# Patient Record
Sex: Female | Born: 1960
Health system: Southern US, Community
[De-identification: ages and names within clinical notes are randomized; demographics above are authoritative.]

## PROBLEM LIST (undated history)

## (undated) DIAGNOSIS — M51379 Other intervertebral disc degeneration, lumbosacral region without mention of lumbar back pain or lower extremity pain: Secondary | ICD-10-CM

## (undated) DIAGNOSIS — F32A Depression, unspecified: Secondary | ICD-10-CM

## (undated) DIAGNOSIS — I10 Essential (primary) hypertension: Secondary | ICD-10-CM

## (undated) DIAGNOSIS — J449 Chronic obstructive pulmonary disease, unspecified: Secondary | ICD-10-CM

## (undated) DIAGNOSIS — F329 Major depressive disorder, single episode, unspecified: Secondary | ICD-10-CM

## (undated) DIAGNOSIS — E559 Vitamin D deficiency, unspecified: Secondary | ICD-10-CM

## (undated) DIAGNOSIS — M5137 Other intervertebral disc degeneration, lumbosacral region: Secondary | ICD-10-CM

## (undated) DIAGNOSIS — E785 Hyperlipidemia, unspecified: Secondary | ICD-10-CM

## (undated) DIAGNOSIS — M1990 Unspecified osteoarthritis, unspecified site: Secondary | ICD-10-CM

## (undated) DIAGNOSIS — K649 Unspecified hemorrhoids: Secondary | ICD-10-CM

## (undated) DIAGNOSIS — IMO0002 Reserved for concepts with insufficient information to code with codable children: Secondary | ICD-10-CM

## (undated) HISTORY — PX: TONSILLECTOMY: SUR1361

## (undated) HISTORY — DX: Major depressive disorder, single episode, unspecified: F32.9

## (undated) HISTORY — DX: Vitamin D deficiency, unspecified: E55.9

## (undated) HISTORY — DX: Unspecified osteoarthritis, unspecified site: M19.90

## (undated) HISTORY — DX: Unspecified hemorrhoids: K64.9

## (undated) HISTORY — DX: Essential (primary) hypertension: I10

## (undated) HISTORY — DX: Chronic obstructive pulmonary disease, unspecified: J44.9

## (undated) HISTORY — PX: MOUTH SURGERY: SHX715

## (undated) HISTORY — DX: Depression, unspecified: F32.A

## (undated) HISTORY — DX: Reserved for concepts with insufficient information to code with codable children: IMO0002

---

## 1999-06-15 ENCOUNTER — Other Ambulatory Visit: Admission: RE | Admit: 1999-06-15 | Discharge: 1999-06-15 | Payer: Self-pay | Admitting: Gynecology

## 2000-06-01 HISTORY — PX: VAGINAL HYSTERECTOMY: SUR661

## 2000-06-04 ENCOUNTER — Other Ambulatory Visit: Admission: RE | Admit: 2000-06-04 | Discharge: 2000-06-04 | Payer: Self-pay | Admitting: *Deleted

## 2000-06-04 ENCOUNTER — Encounter (INDEPENDENT_AMBULATORY_CARE_PROVIDER_SITE_OTHER): Payer: Self-pay | Admitting: Specialist

## 2000-12-10 ENCOUNTER — Other Ambulatory Visit: Admission: RE | Admit: 2000-12-10 | Discharge: 2000-12-10 | Payer: Self-pay | Admitting: *Deleted

## 2000-12-10 ENCOUNTER — Encounter: Admission: RE | Admit: 2000-12-10 | Discharge: 2000-12-10 | Payer: Self-pay | Admitting: *Deleted

## 2000-12-10 ENCOUNTER — Encounter: Payer: Self-pay | Admitting: *Deleted

## 2001-01-20 ENCOUNTER — Other Ambulatory Visit: Admission: RE | Admit: 2001-01-20 | Discharge: 2001-01-20 | Payer: Self-pay | Admitting: *Deleted

## 2001-07-29 ENCOUNTER — Other Ambulatory Visit: Admission: RE | Admit: 2001-07-29 | Discharge: 2001-07-29 | Payer: Self-pay | Admitting: *Deleted

## 2002-01-15 ENCOUNTER — Encounter: Payer: Self-pay | Admitting: *Deleted

## 2002-01-15 ENCOUNTER — Encounter: Admission: RE | Admit: 2002-01-15 | Discharge: 2002-01-15 | Payer: Self-pay | Admitting: *Deleted

## 2002-01-15 ENCOUNTER — Other Ambulatory Visit: Admission: RE | Admit: 2002-01-15 | Discharge: 2002-01-15 | Payer: Self-pay | Admitting: *Deleted

## 2002-12-09 ENCOUNTER — Encounter: Payer: Self-pay | Admitting: Family Medicine

## 2002-12-09 ENCOUNTER — Ambulatory Visit (HOSPITAL_COMMUNITY): Admission: RE | Admit: 2002-12-09 | Discharge: 2002-12-09 | Payer: Self-pay | Admitting: Family Medicine

## 2003-01-27 ENCOUNTER — Encounter: Admission: RE | Admit: 2003-01-27 | Discharge: 2003-01-27 | Payer: Self-pay | Admitting: *Deleted

## 2003-01-27 ENCOUNTER — Encounter: Payer: Self-pay | Admitting: *Deleted

## 2003-02-04 ENCOUNTER — Encounter (HOSPITAL_COMMUNITY): Admission: RE | Admit: 2003-02-04 | Discharge: 2003-03-06 | Payer: Self-pay | Admitting: Neurosurgery

## 2003-03-10 ENCOUNTER — Other Ambulatory Visit: Admission: RE | Admit: 2003-03-10 | Discharge: 2003-03-10 | Payer: Self-pay | Admitting: *Deleted

## 2004-02-08 ENCOUNTER — Ambulatory Visit (HOSPITAL_COMMUNITY): Admission: RE | Admit: 2004-02-08 | Discharge: 2004-02-08 | Payer: Self-pay | Admitting: *Deleted

## 2004-04-19 ENCOUNTER — Other Ambulatory Visit: Admission: RE | Admit: 2004-04-19 | Discharge: 2004-04-19 | Payer: Self-pay | Admitting: *Deleted

## 2005-02-14 ENCOUNTER — Ambulatory Visit (HOSPITAL_COMMUNITY): Admission: RE | Admit: 2005-02-14 | Discharge: 2005-02-14 | Payer: Self-pay | Admitting: Neurology

## 2005-06-20 ENCOUNTER — Other Ambulatory Visit: Admission: RE | Admit: 2005-06-20 | Discharge: 2005-06-20 | Payer: Self-pay | Admitting: *Deleted

## 2005-12-13 ENCOUNTER — Ambulatory Visit (HOSPITAL_COMMUNITY): Admission: RE | Admit: 2005-12-13 | Discharge: 2005-12-13 | Payer: Self-pay | Admitting: Family Medicine

## 2006-03-26 ENCOUNTER — Ambulatory Visit (HOSPITAL_COMMUNITY): Admission: RE | Admit: 2006-03-26 | Discharge: 2006-03-26 | Payer: Self-pay | Admitting: *Deleted

## 2006-06-19 ENCOUNTER — Other Ambulatory Visit: Admission: RE | Admit: 2006-06-19 | Discharge: 2006-06-19 | Payer: Self-pay | Admitting: *Deleted

## 2007-04-11 ENCOUNTER — Ambulatory Visit (HOSPITAL_COMMUNITY): Admission: RE | Admit: 2007-04-11 | Discharge: 2007-04-11 | Payer: Self-pay | Admitting: Family Medicine

## 2007-05-19 ENCOUNTER — Ambulatory Visit (HOSPITAL_COMMUNITY): Admission: RE | Admit: 2007-05-19 | Discharge: 2007-05-19 | Payer: Self-pay | Admitting: *Deleted

## 2007-05-22 ENCOUNTER — Encounter: Payer: Self-pay | Admitting: Orthopedic Surgery

## 2007-05-22 ENCOUNTER — Ambulatory Visit (HOSPITAL_COMMUNITY): Admission: RE | Admit: 2007-05-22 | Discharge: 2007-05-22 | Payer: Self-pay | Admitting: Family Medicine

## 2007-06-19 ENCOUNTER — Ambulatory Visit: Payer: Self-pay | Admitting: Orthopedic Surgery

## 2007-06-19 DIAGNOSIS — M67919 Unspecified disorder of synovium and tendon, unspecified shoulder: Secondary | ICD-10-CM | POA: Insufficient documentation

## 2007-06-19 DIAGNOSIS — M719 Bursopathy, unspecified: Secondary | ICD-10-CM

## 2007-06-19 DIAGNOSIS — I10 Essential (primary) hypertension: Secondary | ICD-10-CM | POA: Insufficient documentation

## 2007-06-23 ENCOUNTER — Other Ambulatory Visit: Admission: RE | Admit: 2007-06-23 | Discharge: 2007-06-23 | Payer: Self-pay | Admitting: *Deleted

## 2007-07-08 ENCOUNTER — Ambulatory Visit (HOSPITAL_COMMUNITY): Admission: RE | Admit: 2007-07-08 | Discharge: 2007-07-08 | Payer: Self-pay | Admitting: Family Medicine

## 2008-06-08 ENCOUNTER — Encounter: Admission: RE | Admit: 2008-06-08 | Discharge: 2008-06-08 | Payer: Self-pay | Admitting: Gynecology

## 2008-06-28 ENCOUNTER — Other Ambulatory Visit: Admission: RE | Admit: 2008-06-28 | Discharge: 2008-06-28 | Payer: Self-pay | Admitting: Gynecology

## 2009-01-11 ENCOUNTER — Ambulatory Visit (HOSPITAL_COMMUNITY): Admission: RE | Admit: 2009-01-11 | Discharge: 2009-01-11 | Payer: Self-pay | Admitting: Internal Medicine

## 2009-06-14 ENCOUNTER — Encounter: Admission: RE | Admit: 2009-06-14 | Discharge: 2009-06-14 | Payer: Self-pay | Admitting: Gynecology

## 2009-06-21 ENCOUNTER — Encounter: Admission: RE | Admit: 2009-06-21 | Discharge: 2009-06-21 | Payer: Self-pay | Admitting: Gynecology

## 2009-07-02 HISTORY — PX: COLONOSCOPY: SHX174

## 2009-12-13 ENCOUNTER — Ambulatory Visit: Payer: Self-pay | Admitting: Internal Medicine

## 2009-12-13 DIAGNOSIS — R141 Gas pain: Secondary | ICD-10-CM | POA: Insufficient documentation

## 2009-12-13 DIAGNOSIS — R198 Other specified symptoms and signs involving the digestive system and abdomen: Secondary | ICD-10-CM | POA: Insufficient documentation

## 2009-12-13 DIAGNOSIS — R142 Eructation: Secondary | ICD-10-CM

## 2009-12-13 DIAGNOSIS — R143 Flatulence: Secondary | ICD-10-CM

## 2009-12-13 DIAGNOSIS — R197 Diarrhea, unspecified: Secondary | ICD-10-CM | POA: Insufficient documentation

## 2009-12-15 ENCOUNTER — Encounter: Payer: Self-pay | Admitting: Internal Medicine

## 2010-01-09 ENCOUNTER — Ambulatory Visit (HOSPITAL_COMMUNITY): Admission: RE | Admit: 2010-01-09 | Discharge: 2010-01-09 | Payer: Self-pay | Admitting: Internal Medicine

## 2010-01-09 ENCOUNTER — Ambulatory Visit: Payer: Self-pay | Admitting: Internal Medicine

## 2010-01-31 ENCOUNTER — Encounter: Admission: RE | Admit: 2010-01-31 | Discharge: 2010-01-31 | Payer: Self-pay | Admitting: Gynecology

## 2010-06-19 ENCOUNTER — Encounter
Admission: RE | Admit: 2010-06-19 | Discharge: 2010-06-19 | Payer: Self-pay | Source: Home / Self Care | Attending: Gynecology | Admitting: Gynecology

## 2010-07-23 ENCOUNTER — Encounter: Payer: Self-pay | Admitting: *Deleted

## 2010-08-03 NOTE — Assessment & Plan Note (Signed)
Summary: CHANGE IN BOWEL HABITS/SS   Visit Type:  consult Primary Care Provider:  [facility]  Chief Complaint:  change in bowel habits x 2 months.  History of Present Illness: Natasha [name] is a pleasant 50 y/o WF, patient of [facility], who presents for further evaluation of change in bowel habits. For two months, she has noted intermittent diarrhea and constipation, gas. She may have loose stools for two days followed by constipation for two days. Prior to this, she had one BM daily. She had been taking Marita [place] one daily. She's not sure if this caused the change in her stool. She also started Naproxyn 500mg  once to twice daily for the last few months. She takes probiotics daily. She takes multiple vitamins as well. She recently cut back on dairy and gluten. BMs are some better this past week. No melean, brbpr. No n/v, heartburn, dysphagia, weight loss. No prior TCS.   Current Medications (verified): 1)  Ziac 2)  Cozaar 3)  Wellbutrin 4)  Estrace 0.1 Mg/gm Crea (Estradiol) .... Once Daily 5)  Naprosyn 500 Mg Tabs (Naproxen) .... One To Two Daily Prn 6)  Vitamins  Allergies (verified): No Known Drug Allergies  Past History:  Past Medical History: Degenerative disc disease Depression Arthritis Hypertension COPD Vit D deficiency  Past Surgical History: Tonsillectomy Hysterectomy, partial  Family History: Family History of Diabetes Family History Coronary Heart Disease female < 15 Father, bladder cancer, CAD - multiple stents, DM Mother, COPD Hx, family, chronic respiratory condition No FH of CRC, colon polyps, liver.  Social History: Patient is single. No children. Used to drink daily, now drinks twice per month. 1/2 ppd. Works at [employer] in [place].  Review of Systems General:  Denies fever, chills, sweats, anorexia, fatigue, weakness, and weight loss. Eyes:  Denies vision loss. ENT:  Denies nasal congestion, sore throat,  hoarseness, and difficulty swallowing. CV:  Denies chest pains, angina, palpitations, syncope, and peripheral edema. Resp:  Complains of dyspnea with exercise; denies dyspnea at rest, cough, sputum, and wheezing. GI:  See HPI. GU:  Denies urinary burning and blood in urine. MS:  Complains of joint pain / LOM and low back pain. Derm:  Denies rash and itching. Neuro:  Denies weakness, frequent headaches, memory loss, and confusion. Psych:  Denies depression and anxiety. Endo:  Denies unusual weight change. Heme:  Denies bruising and bleeding. Allergy:  Denies hives and rash.  Vital Signs:  Patient profile:   50 year old female Height:      70 inches Weight:      176 pounds BMI:     25.34 Temp:     97.5 degrees F oral Pulse rate:   68 / minute BP sitting:   120 / 84  (left arm) Cuff size:   regular  Vitals Entered By: Hendricks Limes LPN (December 13, 2009 8:53 AM)  Physical Exam  General:  Well developed, well nourished, no acute distress. Head:  Normocephalic and atraumatic. Eyes:  Conjunctivae pink, no scleral icterus.  Mouth:  Oropharyngeal mucosa moist, pink.  No lesions, erythema or exudate.    Neck:  Supple; no masses or thyromegaly. Lungs:  Clear throughout to auscultation. Heart:  Regular rate and rhythm; no murmurs, rubs,  or bruits. Abdomen:  Bowel sounds normal.  Abdomen is soft, nontender, nondistended.  No rebound or guarding.  No hepatosplenomegaly, masses or hernias.  No abdominal bruits.  Rectal:  deferred until time of colonoscopy.   Extremities:  No clubbing,  cyanosis, edema or deformities noted. Neurologic:  Alert and  oriented x4;  grossly normal neurologically. Skin:  Intact without significant lesions or rashes. Cervical Nodes:  No significant cervical adenopathy. Psych:  Alert and cooperative. Normal mood and affect.  Impression & Recommendations:  Problem # 1:  CHANGE IN BOWELS (ICD-787.99) Two month h/o change in bowels. Alternating constipation and  diarrhea associated with gas/abd bloating. She has made dietary changes with Adkins shakes as well as use of NSAIDS. This may have contributed to change in stool. She has never had TCS. Colonoscopy to be performed in near future.  Risks, alternatives, and benefits including but not limited to the risk of reaction to medication, bleeding, infection, and perforation were addressed.  Patient voiced understanding and provided verbal consent. Will add different probiotic, Sustenex one daily. #3 boxes provided.  I would like to thank [facility] for allowing [place] to take part in the care of this nice patient.  Appended Document: Orders Update    Clinical Lists Changes  Orders: Added new Service order of Consultation Level IV [phone]) - Signed

## 2010-08-03 NOTE — Assessment & Plan Note (Signed)
Summary: RT SHOULDER PAIN/NO XR/BRINGING MRI FILM FROM [facility]   Vital Signs:  Patient Profile:   50 Years Old Female Weight:      165 pounds Pulse rate:   82 / minute Resp:     16 per minute  Vitals Entered By: Ether Griffins (June 19, 2007 3:34 PM)                 Chief Complaint:  pain in right shoulder.Marland Kitchen  History of Present Illness: I saw Natasha [name] in the office today for an initial visit.    CONSULT: DR.MCGOUGH  She is a 50 years old woman with the complaint of:  right shoulder pain. She fell onto right shoulder in July. She started having pain that day. Pain comes and goes. If she is the motion of throwing she has pain. It hurts her to raise arm.  She takes a few ibuprofen as needed and that helps some. She wenT to physical therapy for 6 visits maybe and that helped some. She had therapy at hand and rehab.  She has not had any regular films.  She has not had any injections in her shoulder, She is not currently taking any medicine  Patient had MRI at [facility] on 05-22-07 of the right shoulder which read mild rotator cuff tendinopathy but no partial or full thickness tear. Mild bursitis.    Current Allergies (reviewed today): No known allergies   Past Medical History:    Reviewed history and no changes required:       High Blood Pressure       Degenerative disc disease       depression  Past Surgical History:    Reviewed history and no changes required:       Tonsillectomy       hysterectomy   Family History:    Reviewed history and no changes required:       Family History of Diabetes       Family History Coronary Heart Disease female < 46       Hx, family, chronic respiratory condition  Social History:    Reviewed history and no changes required:       Patient is single.    Risk Factors:  Tobacco use:  current    Cigarettes:  Yes -- 1 pack(s) per day Caffeine use:  3 drinks per day Alcohol use:  no  Family History  Risk Factors:    Family History of MI in males < 39 years old:  yes   Review of Systems  General      Complains of fever, chills, and fatigue.  Resp      Complains of COPD.  GI      Complains of nausea, vomiting, diarrhea, and constipation.  GU      Complains of burning.  Neuro      Complains of headache.  MS      Complains of joint pain.  Psych      Complains of depression.  EENT      Complains of poor vision and toothaches.  Immunology      Complains of sinus problems.    Shoulder/Elbow Exam  General:    Well-developed, well-nourished, normal body habitus; no deformities, normal grooming.    Skin:    Intact, no scars, lesions, rashes, cafe au lait spots or bruising.    Inspection:    Inspection is normal.    Vascular:    Radial, ulnar, brachial, and  axillary pulses 2+ and symmetric; capillary refill less than 2 seconds; no evidence of ischemia, clubbing, or cyanosis.    Sensory:    Gross sensation intact in the upper extremities.    Motor:    Normal strength in the upper extremities.    Reflexes:    Normal reflexes in the UPPER  extremities.    Shoulder Exam:    Right:    Inspection:  Abnormal    Palpation:  Abnormal    Stability:  stable    Tenderness:  POSTERIOR ACROMION     Swelling:  no    Range of Motion:       Flexion-Active: 150       Flexion-Passive: 150  Impingement Sign NEER:    Right positive Impingement Sign HAWKINS:    Right negative Apprehension Sign:    Right positive  APPREHENSION SIGN PRODUCED PAIN INFERIOR TO THE POSTERIOR ACROMION     Impression & Recommendations:  Problem # 1:  BURSITIS, SHOULDER (ICD-726.10) Assessment: New  Orders: Consultation Level III (04540) Depo- Medrol 40mg  (J1030) Joint Aspirate / Injection, Large (20610) Verbal consent obtained/The right shoulder was injected with depomedrol 40mg /cc and sensorcaine .25% . There were no complications   MRI: [facility]/ bursitis and  tendonosis/tendonopathy.    Orders: Consultation Level III (98119) Depo- Medrol 40mg  (J1030) Joint Aspirate / Injection, Large (20610)   Medications Added to Medication List This Visit: 1)  Ziac  2)  Cozaar  3)  Wellbutrin    Patient Instructions: 1)  ibuprofen 800mg  three times a day. 2)  do exercises daily for 6 weeks. 3)  You have received an injection of cortisone today. You may experience increased pain at the injection site. Apply ice pack to the area for 20 minutes every 2 hours and take 2 xtra strength tylenol every 8 hours. This increased pain will usually resolve in 24 hours. The injection will take effect in 3-10 days.  4)  Please schedule a follow-up appointment as needed.    ]

## 2010-08-03 NOTE — Letter (Signed)
Summary: PCP REFERRAL  PCP REFERRAL   Imported By: Ave Filter 12/15/2009 13:57:42  _____________________________________________________________________  External Attachment:    Type:   Image     Comment:   External Document

## 2010-08-03 NOTE — Letter (Signed)
Summary: TCS ORDER  TCS ORDER   Imported By: Ave Filter 12/13/2009 09:53:43  _____________________________________________________________________  External Attachment:    Type:   Image     Comment:   External Document

## 2010-12-25 ENCOUNTER — Other Ambulatory Visit: Payer: Self-pay | Admitting: Family Medicine

## 2010-12-25 ENCOUNTER — Ambulatory Visit (HOSPITAL_COMMUNITY)
Admission: RE | Admit: 2010-12-25 | Discharge: 2010-12-25 | Disposition: A | Discharge status: Home / Self Care | Payer: BC Managed Care – PPO | Source: Ambulatory Visit | Attending: Family Medicine | Admitting: Family Medicine

## 2010-12-25 DIAGNOSIS — M542 Cervicalgia: Secondary | ICD-10-CM | POA: Insufficient documentation

## 2010-12-25 DIAGNOSIS — M79609 Pain in unspecified limb: Secondary | ICD-10-CM | POA: Insufficient documentation

## 2010-12-25 DIAGNOSIS — M5030 Other cervical disc degeneration, unspecified cervical region: Secondary | ICD-10-CM | POA: Insufficient documentation

## 2010-12-25 DIAGNOSIS — R209 Unspecified disturbances of skin sensation: Secondary | ICD-10-CM | POA: Insufficient documentation

## 2010-12-25 DIAGNOSIS — IMO0002 Reserved for concepts with insufficient information to code with codable children: Secondary | ICD-10-CM | POA: Insufficient documentation

## 2010-12-25 DIAGNOSIS — M546 Pain in thoracic spine: Secondary | ICD-10-CM | POA: Insufficient documentation

## 2010-12-25 DIAGNOSIS — M792 Neuralgia and neuritis, unspecified: Secondary | ICD-10-CM

## 2010-12-31 HISTORY — PX: CERVICAL DISC SURGERY: SHX588

## 2011-01-15 ENCOUNTER — Other Ambulatory Visit: Payer: Self-pay | Admitting: Family Medicine

## 2011-01-15 DIAGNOSIS — M79602 Pain in left arm: Secondary | ICD-10-CM

## 2011-01-18 ENCOUNTER — Ambulatory Visit (HOSPITAL_COMMUNITY)
Admission: RE | Admit: 2011-01-18 | Discharge: 2011-01-18 | Disposition: A | Discharge status: Home / Self Care | Payer: BC Managed Care – PPO | Source: Ambulatory Visit | Attending: Family Medicine | Admitting: Family Medicine

## 2011-01-18 DIAGNOSIS — M542 Cervicalgia: Secondary | ICD-10-CM | POA: Insufficient documentation

## 2011-01-18 DIAGNOSIS — R209 Unspecified disturbances of skin sensation: Secondary | ICD-10-CM | POA: Insufficient documentation

## 2011-01-18 DIAGNOSIS — M5020 Other cervical disc displacement, unspecified cervical region: Secondary | ICD-10-CM | POA: Insufficient documentation

## 2011-01-18 DIAGNOSIS — M79602 Pain in left arm: Secondary | ICD-10-CM

## 2011-01-19 ENCOUNTER — Ambulatory Visit (HOSPITAL_COMMUNITY)
Admission: RE | Admit: 2011-01-19 | Discharge: 2011-01-19 | Disposition: A | Discharge status: Home / Self Care | Payer: BC Managed Care – PPO | Source: Ambulatory Visit | Attending: Neurosurgery | Admitting: Neurosurgery

## 2011-01-19 ENCOUNTER — Other Ambulatory Visit (HOSPITAL_COMMUNITY): Payer: Self-pay | Admitting: Neurosurgery

## 2011-01-19 ENCOUNTER — Encounter (HOSPITAL_COMMUNITY)
Admission: RE | Admit: 2011-01-19 | Discharge: 2011-01-19 | Disposition: A | Discharge status: Home / Self Care | Payer: BC Managed Care – PPO | Source: Ambulatory Visit | Attending: Neurosurgery | Admitting: Neurosurgery

## 2011-01-19 DIAGNOSIS — J4489 Other specified chronic obstructive pulmonary disease: Secondary | ICD-10-CM | POA: Insufficient documentation

## 2011-01-19 DIAGNOSIS — M4322 Fusion of spine, cervical region: Secondary | ICD-10-CM

## 2011-01-19 DIAGNOSIS — Z01810 Encounter for preprocedural cardiovascular examination: Secondary | ICD-10-CM | POA: Insufficient documentation

## 2011-01-19 DIAGNOSIS — I10 Essential (primary) hypertension: Secondary | ICD-10-CM | POA: Insufficient documentation

## 2011-01-19 DIAGNOSIS — J449 Chronic obstructive pulmonary disease, unspecified: Secondary | ICD-10-CM | POA: Insufficient documentation

## 2011-01-19 DIAGNOSIS — Z01818 Encounter for other preprocedural examination: Secondary | ICD-10-CM | POA: Insufficient documentation

## 2011-01-19 DIAGNOSIS — Z01812 Encounter for preprocedural laboratory examination: Secondary | ICD-10-CM | POA: Insufficient documentation

## 2011-01-19 LAB — CBC
HCT: 41.3 % (ref 36.0–46.0)
Hemoglobin: 14 g/dL (ref 12.0–15.0)
MCH: 30.6 pg (ref 26.0–34.0)
MCHC: 33.9 g/dL (ref 30.0–36.0)
MCV: 90.2 fL (ref 78.0–100.0)
Platelets: 315 10*3/uL (ref 150–400)
RBC: 4.58 MIL/uL (ref 3.87–5.11)
RDW: 12.9 % (ref 11.5–15.5)
WBC: 7.7 10*3/uL (ref 4.0–10.5)

## 2011-01-19 LAB — BASIC METABOLIC PANEL
BUN: 21 mg/dL (ref 6–23)
CO2: 31 mEq/L (ref 19–32)
Calcium: 9.7 mg/dL (ref 8.4–10.5)
Chloride: 99 mEq/L (ref 96–112)
Creatinine, Ser: 1.08 mg/dL (ref 0.50–1.10)
GFR calc Af Amer: >60 mL/min (ref >60)
GFR calc non Af Amer: 53 mL/min — ABNORMAL LOW (ref >60)
Glucose, Bld: 83 mg/dL (ref 70–99)
Potassium: 4.2 mEq/L (ref 3.5–5.1)
Sodium: 138 mEq/L (ref 135–145)

## 2011-01-19 LAB — SURGICAL PCR SCREEN
MRSA, PCR: NEGATIVE
Staphylococcus aureus: NEGATIVE

## 2011-01-23 ENCOUNTER — Ambulatory Visit (HOSPITAL_COMMUNITY)
Admission: RE | Admit: 2011-01-23 | Discharge: 2011-01-24 | Disposition: A | Discharge status: Home / Self Care | Payer: BC Managed Care – PPO | Source: Ambulatory Visit | Attending: Neurosurgery | Admitting: Neurosurgery

## 2011-01-23 ENCOUNTER — Ambulatory Visit (HOSPITAL_COMMUNITY): Payer: BC Managed Care – PPO

## 2011-01-23 DIAGNOSIS — M5020 Other cervical disc displacement, unspecified cervical region: Secondary | ICD-10-CM | POA: Insufficient documentation

## 2011-01-23 DIAGNOSIS — I10 Essential (primary) hypertension: Secondary | ICD-10-CM | POA: Insufficient documentation

## 2011-01-23 DIAGNOSIS — M47812 Spondylosis without myelopathy or radiculopathy, cervical region: Secondary | ICD-10-CM | POA: Insufficient documentation

## 2011-01-23 DIAGNOSIS — F17200 Nicotine dependence, unspecified, uncomplicated: Secondary | ICD-10-CM | POA: Insufficient documentation

## 2011-01-23 DIAGNOSIS — Z01818 Encounter for other preprocedural examination: Secondary | ICD-10-CM | POA: Insufficient documentation

## 2011-01-23 DIAGNOSIS — Z01810 Encounter for preprocedural cardiovascular examination: Secondary | ICD-10-CM | POA: Insufficient documentation

## 2011-01-23 DIAGNOSIS — Z01812 Encounter for preprocedural laboratory examination: Secondary | ICD-10-CM | POA: Insufficient documentation

## 2011-01-29 NOTE — Discharge Summary (Signed)
  Natasha [name], KIGHT NO.:  [id number]  MEDICAL RECORD NO.:  [id number]  LOCATION:  3526                         FACILITY:  [facility]  PHYSICIAN:  Coletta Memos, M.D.     DATE OF BIRTH:  1960-07-12  DATE OF ADMISSION:  01/23/2011 DATE OF DISCHARGE:  01/24/2011                              DISCHARGE SUMMARY   ADMITTING DIAGNOSES:  Cervical displaced disk C5-6, cervical spondylosis C5-6, cervical stenosis C5-6, cervical radiculopathy C6.  DISCHARGE DIAGNOSES:  Cervical displaced disk C5-6, cervical spondylosis C5-6, cervical stenosis C5-6, cervical radiculopathy C6.  DISCHARGE DESTINATION:  Home.  STATUS:  Alive and well.  COMPLICATIONS:  None.  SURGEON:  Coletta Memos, MD  PROCEDURE:  Anterior cervical decompression arthrodesis with Vectra instrumentation.  She was sent home.  Wound was clean, dry, no signs of infection.  She was able to tolerate a regular diet.  She was able to void and ambulate without difficulty.  She has a normal speaking voice. She was given prescriptions for Flexeril and hydrocodone 5/325 and acetaminophen tablets.  I will see her in the office in 3-4 weeks. Strength was full at discharge.          ______________________________ Coletta Memos, M.D.     KC/MEDQ  D:  01/24/2011  T:  01/25/2011  Job:  [id number]  Electronically Signed by Coletta Memos M.D. on 01/29/2011 03:10:04 PM

## 2011-01-29 NOTE — Op Note (Signed)
[name], [name] NO.:  [id number]  MEDICAL RECORD NO.:  [id number]  LOCATION:  3526                         FACILITY:  [facility]  PHYSICIAN:  Coletta Memos, M.D.     DATE OF BIRTH:  [birth year]  DATE OF PROCEDURE:  01/23/2011 DATE OF DISCHARGE:  01/24/2011                              OPERATIVE REPORT   PREOPERATIVE DIAGNOSES: 1. Displaced disk, C5-C6. 2. Cervical spondylosis without myelopathy, cervical radiculopathy C6.  POSTOPERATIVE DIAGNOSES: 1. Displaced disk, C5-C6. 2. Cervical spondylosis without myelopathy, cervical radiculopathy C6.  PROCEDURES: 1. Anterior cervical decompression C5-C6. 2. Anterior arthrodesis, C5-C6 with structural allograft. 3. Anterior instrumentation with Vectra plate.  SURGEON:  Coletta Memos, MD  COMPLICATIONS:  None.  INDICATIONS:  [name] is a 50 year old with severe cord compression due to a large herniated disk at C5-C6.  She has left-sided weakness in the C6 distribution.  She has pain in that left C6 distribution.  I therefore recommended and she has agreed to undergo operative decompression.  OPERATIVE NOTE:  Ms. Braun was brought to the operating room, intubated, and placed under general anesthetic without difficulty.  Head was placed in neutral position on a horseshoe headrest.  The neck was prepped and she was draped in a sterile fashion.  I opened the skin after infiltrating 4 mL of 0.5% lidocaine with 1:200,000 epinephrine from the midline extending to the medial border of the left sternocleidomastoid muscle.  I dissected to the level of the platysma and I opened that in a horizontal fashion using Metzenbaum scissors.  I then dissected rostrally and caudally in a plane both superior and inferior to the platysma.  I then with both blunt and sharp technique dissected through an avascular corridor to the cervical spine.  I placed spinal needle and I was able to localize my position.  I then reflected  longus colli muscles bilaterally and placed a self-retaining retractor.  I placed distraction pins, one at C5 and one at C6 and then proceeded with my decompression at C5-C6.  I decompressed the spinal canal at C5-C6 using pituitary rongeurs, Kerrison punches, and a high-speed drill.  I removed what was a large amount of disk and osteophytes.  I aggressively decompressed the neural foramina, so that both C6 nerve roots had free egress with removal of the osteophytes and posterior longitudinal ligament at the thecal sac in view.  This was done with microscopic dissection.  I undercut the vertebral bodies at C5-C6 to ensure that there was adequate decompression.  Once that was done, I prepared for arthrodesis.  I arthrodesed the C5-C6 segment using a structural allograft.  I did this by using a high-speed drill to prepare the endplates at C5 and C6. I sized the space and placed a 7-mm structural allograft from [facility]. Having placed a graft and seeing that it had a secure fit, I then prepared for instrumentation.  With Dr. Lovell Sheehan assistance, we placed a 16-mm cervical plate, two screws in C5, two screws in C6, 14-mm screws. There was no complication.  I irrigated the wound.  I then closed the wound in a layered fashion using Vicryl sutures to reapproximate the platysma in a subcuticular layer.  I used Dermabond for sterile dressing.  X-ray showed the plate, plug, and screws all in good position.          ______________________________ Coletta Memos, M.D.     KC/MEDQ  D:  01/24/2011  T:  01/25/2011  Job:  [id number]  Electronically Signed by Coletta Memos M.D. on 01/29/2011 03:10:02 PM

## 2011-03-27 ENCOUNTER — Ambulatory Visit: Payer: BC Managed Care – PPO | Attending: Neurosurgery

## 2011-03-27 DIAGNOSIS — R5381 Other malaise: Secondary | ICD-10-CM | POA: Insufficient documentation

## 2011-03-27 DIAGNOSIS — M542 Cervicalgia: Secondary | ICD-10-CM | POA: Insufficient documentation

## 2011-03-27 DIAGNOSIS — IMO0001 Reserved for inherently not codable concepts without codable children: Secondary | ICD-10-CM | POA: Insufficient documentation

## 2011-03-27 DIAGNOSIS — M25539 Pain in unspecified wrist: Secondary | ICD-10-CM | POA: Insufficient documentation

## 2011-04-05 ENCOUNTER — Ambulatory Visit: Payer: BC Managed Care – PPO | Attending: Neurosurgery

## 2011-04-05 DIAGNOSIS — M542 Cervicalgia: Secondary | ICD-10-CM | POA: Insufficient documentation

## 2011-04-05 DIAGNOSIS — IMO0001 Reserved for inherently not codable concepts without codable children: Secondary | ICD-10-CM | POA: Insufficient documentation

## 2011-04-05 DIAGNOSIS — M25539 Pain in unspecified wrist: Secondary | ICD-10-CM | POA: Insufficient documentation

## 2011-04-05 DIAGNOSIS — R5381 Other malaise: Secondary | ICD-10-CM | POA: Insufficient documentation

## 2011-04-13 ENCOUNTER — Ambulatory Visit: Payer: BC Managed Care – PPO | Admitting: Physical Therapy

## 2011-10-04 ENCOUNTER — Other Ambulatory Visit (HOSPITAL_COMMUNITY): Payer: Self-pay | Admitting: Physical Medicine and Rehabilitation

## 2011-10-04 ENCOUNTER — Ambulatory Visit (HOSPITAL_COMMUNITY)
Admission: RE | Admit: 2011-10-04 | Discharge: 2011-10-04 | Disposition: A | Discharge status: Home / Self Care | Payer: BC Managed Care – PPO | Source: Ambulatory Visit | Attending: Physical Medicine and Rehabilitation | Admitting: Physical Medicine and Rehabilitation

## 2011-10-04 DIAGNOSIS — M5137 Other intervertebral disc degeneration, lumbosacral region: Secondary | ICD-10-CM

## 2011-10-04 DIAGNOSIS — M545 Low back pain, unspecified: Secondary | ICD-10-CM

## 2011-10-04 DIAGNOSIS — M47817 Spondylosis without myelopathy or radiculopathy, lumbosacral region: Secondary | ICD-10-CM

## 2011-10-04 DIAGNOSIS — G894 Chronic pain syndrome: Secondary | ICD-10-CM

## 2011-10-04 DIAGNOSIS — M51379 Other intervertebral disc degeneration, lumbosacral region without mention of lumbar back pain or lower extremity pain: Secondary | ICD-10-CM | POA: Insufficient documentation

## 2012-01-25 ENCOUNTER — Telehealth: Payer: Self-pay | Admitting: Gastroenterology

## 2012-01-25 NOTE — Telephone Encounter (Signed)
Dr Rhea Belton, info is in your box for review; will you accept this pt? Thanks.

## 2012-01-25 NOTE — Telephone Encounter (Signed)
Informed Malachi Bonds that we cannot accept the pt because she has been seen by [facility]. Malachi Bonds states pt does not want to go there anymore and wants to come here. Explained we will need her records and a doc will have to agree to accept her. Malachi Bonds stated understanding and will fax the info.

## 2012-01-31 NOTE — Telephone Encounter (Signed)
Okay to schedule, new pt slot.

## 2012-02-01 NOTE — Telephone Encounter (Signed)
Informed Natasha [name] that Dr Rhea Belton has agreed to see the pt, but it will be awhile before he can see her; 02/26/12. She may be able to see a midlevel before that if she is still bleeding. Natasha [name] will check on pt and call me 02/04/12.

## 2012-02-06 NOTE — Telephone Encounter (Signed)
Natasha [name] with Dr Timothy Lasso will notify pt of appt; she is not bleeding at the present time.

## 2012-02-25 ENCOUNTER — Encounter: Payer: Self-pay | Admitting: Internal Medicine

## 2012-02-26 ENCOUNTER — Encounter: Payer: Self-pay | Admitting: Internal Medicine

## 2012-02-26 ENCOUNTER — Ambulatory Visit (INDEPENDENT_AMBULATORY_CARE_PROVIDER_SITE_OTHER): Payer: BC Managed Care – PPO | Admitting: Internal Medicine

## 2012-02-26 VITALS — BP 130/70 | HR 66 | Ht 69.25 in | Wt 173.6 lb

## 2012-02-26 DIAGNOSIS — K625 Hemorrhage of anus and rectum: Secondary | ICD-10-CM

## 2012-02-26 NOTE — Patient Instructions (Addendum)
Observe for any more bleeding. Please contact us if you notice anything.   [phone]  Follow up with Dr. Rhea Belton in 3 months

## 2012-02-27 ENCOUNTER — Encounter: Payer: Self-pay | Admitting: Internal Medicine

## 2012-02-27 NOTE — Progress Notes (Signed)
Patient ID: Natasha [name], female   DOB: [birth year], 51 y.o.   MRN: [id number]  SUBJECTIVE: HPI Natasha [name] is a 51 yo female with PMH of hypertension, degenerative disc disease, COPD, and vitamin D deficiency who seen in consultation at the request of Dr. Timothy Lasso for evaluation of intermittent rectal bleeding. The patient reports several times over last 6 weeks seeing bright red blood in the toilet water and also on the toilet tissue. This bleeding was painless, and occurred only once on the days in which she saw blood. She reports significant bleeding only twice in the last 6 weeks, with other times seeing scant red blood on the toilet tissue. She reports regular bowel habits without diarrhea or constipation. No abdominal pain. No nausea or vomiting. Appetite is good. No melena.  She had colonoscopy performed on 01/09/2010 by Dr. Jena Gauss.  This exam was to the cecum with good prep. It revealed pan colonic diverticulosis but otherwise normal mucosa.  Review of Systems  As per history of present illness, otherwise negative   Past Medical History  Diagnosis Date  . Hemorrhoids   . Arthritis   . Hypertension   . DDD (degenerative disc disease)   . Depression   . COPD (chronic obstructive pulmonary disease)   . Vitamin d deficiency     Current Outpatient Prescriptions  Medication Sig Dispense Refill  . Bisoprolol-Hydrochlorothiazide (ZIAC PO) Take by mouth daily.      . BuPROPion HCl (WELLBUTRIN PO) Take by mouth. 1 - 2 tabs every day      . Calcium Carbonate (CALTRATE 600 PO) Take by mouth daily. When remembers      . losartan (COZAAR) 25 MG tablet Take 25 mg by mouth daily.      . MULTIPLE VITAMIN PO Take by mouth.      . Naproxen Sodium (ALEVE PO) Take by mouth as needed.        No Known Allergies  Family History  Problem Relation Age of Onset  . Diabetes    . Colitis      History  Substance Use Topics  . Smoking status: Current Some Day Smoker -- 35 years    Types: Cigarettes  .  Smokeless tobacco: Never Used  . Alcohol Use: No     quitting     OBJECTIVE: BP 130/70  Pulse 66  Ht 5' 9.25" (1.759 m)  Wt 173 lb 9.6 oz (78.744 kg)  BMI 25.45 kg/m2 Constitutional: Well-developed and well-nourished. No distress. HEENT: Normocephalic and atraumatic. Oropharynx is clear and moist. No oropharyngeal exudate. Conjunctivae are normal. Pupils are equal round and reactive to light. No scleral icterus. Neck: Neck supple. Trachea midline. Cardiovascular: Normal rate, regular rhythm and intact distal pulses. No M/R/G Pulmonary/chest: Effort normal and breath sounds normal. No wheezing, rales or rhonchi. Abdominal: Soft, nontender, nondistended. Bowel sounds active throughout. There are no masses palpable. No hepatosplenomegaly. Rectal: External skin tags without external hemorrhoids, nontender external exam. Internal exam with soft formed brown stool in the vault, no palpable masses. Normal tone. Extremities: no clubbing, cyanosis, or edema Lymphadenopathy: No cervical adenopathy noted. Neurological: Alert and oriented to person place and time. Skin: Skin is warm and dry. No rashes noted. Psychiatric: Normal mood and affect. Behavior is normal.  ASSESSMENT AND PLAN:  51 yo female with PMH of hypertension, degenerative disc disease, COPD, and vitamin D deficiency who seen in consultation at the request of Dr. Timothy Lasso for evaluation of intermittent rectal bleeding.   1. Rectal bleeding --  it is reassuring that she had a complete colonoscopy 2 years ago and an unremarkable rectal exam today. Her description of bleeding, is not consistent with diverticular hemorrhage given the relatively small volume and isolated episodes. It is possible that this was internal hemorrhoidal bleeding, and at this point I recommend observation. If the bleeding continues or returns over the next 3 months, we will proceed to flexible sigmoidoscopy for more complete evaluation. She understands this plan,  recommendation, and is agreeable. She will contact me should the bleeding returned between now and followup, which will be in 3 months.

## 2013-04-13 ENCOUNTER — Encounter (HOSPITAL_BASED_OUTPATIENT_CLINIC_OR_DEPARTMENT_OTHER): Payer: Self-pay | Admitting: *Deleted

## 2013-04-13 NOTE — Progress Notes (Signed)
To come for [id number] 

## 2013-04-14 ENCOUNTER — Other Ambulatory Visit: Payer: Self-pay | Admitting: Orthopedic Surgery

## 2013-04-15 ENCOUNTER — Encounter (HOSPITAL_BASED_OUTPATIENT_CLINIC_OR_DEPARTMENT_OTHER)
Admission: RE | Admit: 2013-04-15 | Discharge: 2013-04-15 | Disposition: A | Discharge status: Home / Self Care | Payer: BC Managed Care – PPO | Source: Ambulatory Visit | Attending: Orthopedic Surgery | Admitting: Orthopedic Surgery

## 2013-04-15 LAB — POCT I-STAT, CHEM 8
BUN: 25 mg/dL — ABNORMAL HIGH (ref 6–23)
Calcium, Ion: 0.88 mmol/L — ABNORMAL LOW (ref 1.12–1.23)
Chloride: 107 mEq/L (ref 96–112)
Creatinine, Ser: 1.1 mg/dL (ref 0.50–1.10)
Glucose, Bld: 86 mg/dL (ref 70–99)
HCT: 45 % (ref 36.0–46.0)
Hemoglobin: 15.3 g/dL — ABNORMAL HIGH (ref 12.0–15.0)
Potassium: 6.2 mEq/L — ABNORMAL HIGH (ref 3.5–5.1)
Sodium: 135 mEq/L (ref 135–145)
TCO2: 26 mmol/L (ref 0–100)

## 2013-04-15 NOTE — H&P (Signed)
  Natasha [name] is an 52 y.o. female.   Chief Complaint: c/o chronic and progressive STS left thumb HPI: . 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 She has a history of locking of her left thumb IP joint for the past year. She has no antecedent history of injury. Her family history is positive for her father having diabetes and gout. She has no history of diabetes, thyroid disease or gout. She now presents for an upper extremity evaluation. She has undergone two previous injections without long term relief and now requests surgical intervention.    Past Medical History  Diagnosis Date  . Hemorrhoids   . Arthritis   . Hypertension   . DDD (degenerative disc disease)   . Depression   . COPD (chronic obstructive pulmonary disease)   . Vitamin D deficiency     Past Surgical History  Procedure Laterality Date  . Vaginal hysterectomy  06/2000    partial, Dr. Jeanine Luz  . Cervical disc surgery  12/2010    Dr. Franky Macho  . Tonsillectomy      as teenager, in [place]  . Mouth surgery      Family History  Problem Relation Age of Onset  . Diabetes    . Colitis     Social History:  reports that she has been smoking Cigarettes.  She has a 35 pack-year smoking history. She has never used smokeless tobacco. She reports that she drinks alcohol. She reports that she does not use illicit drugs.  Allergies: No Known Allergies  No prescriptions prior to admission    Results for orders placed during the hospital encounter of 04/16/13 (from the past 48 hour(s))  POCT I-STAT, CHEM 8     Status: Abnormal   Collection Time    04/15/13  9:25 AM      Result Value Range   Sodium 135  135 - 145 mEq/L   Potassium 6.2 (*) 3.5 - 5.1 mEq/L   Chloride 107  96 - 112 mEq/L   BUN 25 (*) 6 - 23 mg/dL   Creatinine, Ser 9.56  0.50 - 1.10 mg/dL   Glucose, Bld 86  70 - 99 mg/dL   Calcium, Ion 2.13 (*) 1.12 - 1.23 mmol/L   TCO2 26  0 - 100 mmol/L   Hemoglobin 15.3 (*) 12.0  - 15.0 g/dL   HCT 08.6  57.8 - 46.9 %   Comment MD NOTIFIED, SUGGEST RECOLLECT      No results found.   Pertinent items are noted in HPI.  Height 5\' 10"  (1.778 m), weight 73.936 kg (163 lb).  General appearance: alert Head: Normocephalic, without obvious abnormality Neck: supple, symmetrical, trachea midline Resp: clear to auscultation bilaterally Cardio: regular rate and rhythm GI: normal findings: bowel sounds normal Extremities: Pulses: 2+ and symmetric Skin: normal Neurologic: Grossly normal    Assessment/Plan Impression: STS left thumb  Plan: To the OR for release A-1 pulley left thumb.The procedure, risks,benefits and post-op course were discussed with the patient at length and they were in agreement with the plan.  DASNOIT,Natasha [name] 04/15/2013, 4:08 PM  H&P documentation: 04/16/2013  -History and Physical Reviewed  -Patient has been re-examined  -No change in the plan of care  Natasha Forster, MD

## 2013-04-16 ENCOUNTER — Ambulatory Visit (HOSPITAL_BASED_OUTPATIENT_CLINIC_OR_DEPARTMENT_OTHER)
Admission: RE | Admit: 2013-04-16 | Discharge: 2013-04-16 | Disposition: A | Discharge status: Home / Self Care | Payer: BC Managed Care – PPO | Source: Ambulatory Visit | Attending: Orthopedic Surgery | Admitting: Orthopedic Surgery

## 2013-04-16 ENCOUNTER — Encounter (HOSPITAL_BASED_OUTPATIENT_CLINIC_OR_DEPARTMENT_OTHER): Payer: Self-pay | Admitting: *Deleted

## 2013-04-16 ENCOUNTER — Ambulatory Visit (HOSPITAL_BASED_OUTPATIENT_CLINIC_OR_DEPARTMENT_OTHER): Payer: BC Managed Care – PPO | Admitting: Anesthesiology

## 2013-04-16 ENCOUNTER — Encounter (HOSPITAL_BASED_OUTPATIENT_CLINIC_OR_DEPARTMENT_OTHER): Payer: BC Managed Care – PPO | Admitting: Anesthesiology

## 2013-04-16 ENCOUNTER — Encounter (HOSPITAL_BASED_OUTPATIENT_CLINIC_OR_DEPARTMENT_OTHER)
Admission: RE | Disposition: A | Discharge status: Home / Self Care | Payer: Self-pay | Source: Ambulatory Visit | Attending: Orthopedic Surgery

## 2013-04-16 DIAGNOSIS — J4489 Other specified chronic obstructive pulmonary disease: Secondary | ICD-10-CM | POA: Insufficient documentation

## 2013-04-16 DIAGNOSIS — Z01812 Encounter for preprocedural laboratory examination: Secondary | ICD-10-CM | POA: Insufficient documentation

## 2013-04-16 DIAGNOSIS — J449 Chronic obstructive pulmonary disease, unspecified: Secondary | ICD-10-CM | POA: Insufficient documentation

## 2013-04-16 DIAGNOSIS — M6530 Trigger finger, unspecified finger: Secondary | ICD-10-CM | POA: Insufficient documentation

## 2013-04-16 DIAGNOSIS — IMO0002 Reserved for concepts with insufficient information to code with codable children: Secondary | ICD-10-CM | POA: Insufficient documentation

## 2013-04-16 DIAGNOSIS — Z01810 Encounter for preprocedural cardiovascular examination: Secondary | ICD-10-CM | POA: Insufficient documentation

## 2013-04-16 DIAGNOSIS — I10 Essential (primary) hypertension: Secondary | ICD-10-CM | POA: Insufficient documentation

## 2013-04-16 DIAGNOSIS — M129 Arthropathy, unspecified: Secondary | ICD-10-CM | POA: Insufficient documentation

## 2013-04-16 DIAGNOSIS — F17200 Nicotine dependence, unspecified, uncomplicated: Secondary | ICD-10-CM | POA: Insufficient documentation

## 2013-04-16 DIAGNOSIS — E559 Vitamin D deficiency, unspecified: Secondary | ICD-10-CM | POA: Insufficient documentation

## 2013-04-16 HISTORY — PX: TRIGGER FINGER RELEASE: SHX641

## 2013-04-16 LAB — POCT I-STAT, CHEM 8
BUN: 22 mg/dL (ref 6–23)
Calcium, Ion: 1.25 mmol/L — ABNORMAL HIGH (ref 1.12–1.23)
Chloride: 108 mEq/L (ref 96–112)
Creatinine, Ser: 1.1 mg/dL (ref 0.50–1.10)
Glucose, Bld: 85 mg/dL (ref 70–99)
HCT: 47 % — ABNORMAL HIGH (ref 36.0–46.0)
Hemoglobin: 16 g/dL — ABNORMAL HIGH (ref 12.0–15.0)
Potassium: 4.5 mEq/L (ref 3.5–5.1)
Sodium: 142 mEq/L (ref 135–145)
TCO2: 26 mmol/L (ref 0–100)

## 2013-04-16 SURGERY — RELEASE, A1 PULLEY, FOR TRIGGER FINGER
Anesthesia: Monitor Anesthesia Care | Site: Thumb | Laterality: Left | Wound class: Clean

## 2013-04-16 MED ORDER — CHLORHEXIDINE GLUCONATE 4 % EX LIQD: 60.0000 mL | Freq: Once | CUTANEOUS | Status: DC

## 2013-04-16 MED ORDER — LIDOCAINE HCL 2 % IJ SOLN
INTRAMUSCULAR | Status: AC
  Filled 2013-04-16: qty 20

## 2013-04-16 MED ORDER — LIDOCAINE HCL 2 % IJ SOLN
INTRAMUSCULAR | Status: DC | PRN
  Administered 2013-04-16: 2 mL

## 2013-04-16 MED ORDER — MIDAZOLAM HCL 5 MG/5ML IJ SOLN
INTRAMUSCULAR | Status: DC | PRN
  Administered 2013-04-16: 2 mg via INTRAVENOUS

## 2013-04-16 MED ORDER — ACETAMINOPHEN-CODEINE #3 300-30 MG PO TABS: 1.0000 | ORAL_TABLET | ORAL | Status: DC | PRN

## 2013-04-16 MED ORDER — OXYCODONE HCL 5 MG PO TABS: 5.0000 mg | ORAL_TABLET | Freq: Once | ORAL | Status: DC | PRN

## 2013-04-16 MED ORDER — PROPOFOL INFUSION 10 MG/ML OPTIME
INTRAVENOUS | Status: DC | PRN
  Administered 2013-04-16: 100 ug/kg/min via INTRAVENOUS

## 2013-04-16 MED ORDER — LACTATED RINGERS IV SOLN
INTRAVENOUS | Status: DC
  Administered 2013-04-16: 08:00:00 via INTRAVENOUS

## 2013-04-16 MED ORDER — METOCLOPRAMIDE HCL 5 MG/ML IJ SOLN: 10.0000 mg | Freq: Once | INTRAMUSCULAR | Status: DC | PRN

## 2013-04-16 MED ORDER — FENTANYL CITRATE 0.05 MG/ML IJ SOLN
INTRAMUSCULAR | Status: AC
  Filled 2013-04-16: qty 2

## 2013-04-16 MED ORDER — ONDANSETRON HCL 4 MG/2ML IJ SOLN
INTRAMUSCULAR | Status: DC | PRN
  Administered 2013-04-16: 4 mg via INTRAMUSCULAR

## 2013-04-16 MED ORDER — OXYCODONE HCL 5 MG/5ML PO SOLN: 5.0000 mg | Freq: Once | ORAL | Status: DC | PRN

## 2013-04-16 MED ORDER — FENTANYL CITRATE 0.05 MG/ML IJ SOLN: 25.0000 ug | INTRAMUSCULAR | Status: DC | PRN

## 2013-04-16 MED ORDER — MIDAZOLAM HCL 2 MG/2ML IJ SOLN
INTRAMUSCULAR | Status: AC
  Filled 2013-04-16: qty 2

## 2013-04-16 MED ORDER — FENTANYL CITRATE 0.05 MG/ML IJ SOLN
INTRAMUSCULAR | Status: DC | PRN
  Administered 2013-04-16: 50 ug via INTRAVENOUS

## 2013-04-16 SURGICAL SUPPLY — 39 items
BANDAGE ADHESIVE 1X3 (GAUZE/BANDAGES/DRESSINGS) IMPLANT
BANDAGE COBAN STERILE 2 (GAUZE/BANDAGES/DRESSINGS) ×2 IMPLANT
BLADE SURG 15 STRL LF DISP TIS (BLADE) ×1 IMPLANT
BLADE SURG 15 STRL SS (BLADE) ×2
BNDG CMPR 9X4 STRL LF SNTH (GAUZE/BANDAGES/DRESSINGS) ×1
BNDG ESMARK 4X9 LF (GAUZE/BANDAGES/DRESSINGS) ×2 IMPLANT
BRUSH SCRUB EZ PLAIN DRY (MISCELLANEOUS) ×2 IMPLANT
CLOTH BEACON ORANGE TIMEOUT ST (SAFETY) ×2 IMPLANT
CORDS BIPOLAR (ELECTRODE) ×2 IMPLANT
COVER MAYO STAND STRL (DRAPES) ×2 IMPLANT
COVER TABLE BACK 60X90 (DRAPES) ×2 IMPLANT
CUFF TOURNIQUET SINGLE 18IN (TOURNIQUET CUFF) ×2 IMPLANT
DECANTER SPIKE VIAL GLASS SM (MISCELLANEOUS) ×2 IMPLANT
DRAPE EXTREMITY T 121X128X90 (DRAPE) ×2 IMPLANT
DRAPE SURG 17X23 STRL (DRAPES) ×2 IMPLANT
GLOVE BIOGEL M STRL SZ7.5 (GLOVE) IMPLANT
GLOVE BIOGEL PI IND STRL 7.0 (GLOVE) ×1 IMPLANT
GLOVE BIOGEL PI INDICATOR 7.0 (GLOVE) ×1
GLOVE ECLIPSE 6.5 STRL STRAW (GLOVE) ×2 IMPLANT
GLOVE EXAM NITRILE MD LF STRL (GLOVE) ×2 IMPLANT
GLOVE ORTHO TXT STRL SZ7.5 (GLOVE) ×2 IMPLANT
GOWN BRE IMP PREV XXLGXLNG (GOWN DISPOSABLE) ×2 IMPLANT
GOWN PREVENTION PLUS XLARGE (GOWN DISPOSABLE) ×2 IMPLANT
NEEDLE 27GAX1X1/2 (NEEDLE) ×2 IMPLANT
PACK BASIN DAY SURGERY FS (CUSTOM PROCEDURE TRAY) ×2 IMPLANT
PADDING CAST ABS 4INX4YD NS (CAST SUPPLIES) ×1
PADDING CAST ABS COTTON 4X4 ST (CAST SUPPLIES) ×1 IMPLANT
SPONGE GAUZE 4X4 12PLY (GAUZE/BANDAGES/DRESSINGS) ×2 IMPLANT
STOCKINETTE 4X48 STRL (DRAPES) ×2 IMPLANT
STRIP CLOSURE SKIN 1/2X4 (GAUZE/BANDAGES/DRESSINGS) ×2 IMPLANT
SUT ETHILON 5 0 P 3 18 (SUTURE)
SUT NYLON ETHILON 5-0 P-3 1X18 (SUTURE) IMPLANT
SUT PROLENE 3 0 PS 2 (SUTURE) ×2 IMPLANT
SUT PROLENE 4 0 P 3 18 (SUTURE) IMPLANT
SYR 3ML 23GX1 SAFETY (SYRINGE) IMPLANT
SYR CONTROL 10ML LL (SYRINGE) ×2 IMPLANT
TOWEL OR 17X24 6PK STRL BLUE (TOWEL DISPOSABLE) ×2 IMPLANT
TRAY DSU PREP LF (CUSTOM PROCEDURE TRAY) ×2 IMPLANT
UNDERPAD 30X30 INCONTINENT (UNDERPADS AND DIAPERS) ×2 IMPLANT

## 2013-04-16 NOTE — Transfer of Care (Signed)
Immediate Anesthesia Transfer of Care Note  Patient: Natasha [name]  Procedure(s) Performed: Procedure(s): RELEASE TRIGGER FINGER/A-1 PULLEY LEFT THUMB (Left)  Patient Location: PACU  Anesthesia Type:MAC  Level of Consciousness: awake  Airway & Oxygen Therapy: Patient Spontanous Breathing and Patient connected to face mask oxygen  Post-op Assessment: Report given to PACU RN and Post -op Vital signs reviewed and stable  Post vital signs: Reviewed and stable  Complications: No apparent anesthesia complications

## 2013-04-16 NOTE — Anesthesia Postprocedure Evaluation (Signed)
Anesthesia Post Note  Patient: Natasha [name]  Procedure(s) Performed: Procedure(s) (LRB): RELEASE TRIGGER FINGER/A-1 PULLEY LEFT THUMB (Left)  Anesthesia type: MAC  Patient location: PACU  Post pain: Pain level controlled  Post assessment: Patient's Cardiovascular Status Stable  Last Vitals:  Filed Vitals:   04/16/13 0930  BP: 136/76  Pulse: 73  Temp:   Resp: 19    Post vital signs: Reviewed and stable  Level of consciousness: alert  Complications: No apparent anesthesia complications

## 2013-04-16 NOTE — Op Note (Deleted)
[name], [name] NO.:  [id number]  MEDICAL RECORD NO.:  [id number]  LOCATION:                               FACILITY:  [facility]  PHYSICIAN:  Katy Fitch. Aarvi Stotts, M.D. DATE OF BIRTH:  1961/05/25  DATE OF PROCEDURE:  04/16/2013 DATE OF DISCHARGE:  04/16/2013                              OPERATIVE REPORT   PREOPERATIVE DIAGNOSIS:  Chronic stenosing tenosynovitis, left thumb at A1 pulley.  POSTOPERATIVE DIAGNOSIS:  Chronic stenosing tenosynovitis, left thumb at A1 pulley.  OPERATION:  Release of left thumb A1 pulley.  OPERATING SURGEON:  Katy Fitch. Latara Micheli, MD.  ASSISTANT:  Surgical technician.  ANESTHESIA:  2% lidocaine, flexor sheath and metacarpal head level block supplemented by IV sedation, i.e. monitored anesthesia care.  SUPERVISING ANESTHESIOLOGIST:  Janetta Hora. Gelene Mink, MD.  INDICATION:  Natasha [name] is a 52 year old woman referred by Dr. Timothy Lasso for evaluation and management of stenosing tenosynovitis of left thumb. She had a history of chronic triggering and pain.  She has not responded to nonoperative measures.  She is now brought to the operating room for definitive release of her left thumb A1 pulley.  Preoperatively, she was interviewed in the holding area.  Her proper surgical site was identified per protocol and marked with a marking pen. Questions were invited and answered in detail with Natasha [name] and her spouse.  After informed consent, she is brought to the operating room at this time.  PROCEDURE:  Natasha [name] was brought to room 2 of the [facility] and placed in supine position on the operating table.  Following induction of general anesthesia by LMA technique, the left hand and arm were prepped with Betadine soap and solution, sterilely draped.  A 2% lidocaine was infiltrated in the path intended incision and into the flexor sheath of the left thumb, total volume 2 mL was provided.  After exsanguination left arm with  Esmarch bandage, arterial tourniquet on proximal brachium inflated 250 mmHg due to mild systolic hypertension noted during sedation.  Following routine surgical time-out, procedure commenced with a short transverse incision directly over the palpably thickened A1 pulley. Subcutaneous tissues were carefully divided, taking care to identify the radial proper digital nerve and carefully place Ragnell retractors x3. The A1 pulley was split with scalpel scissors.  The tenotomy scissors were passed proximally and distally to be sure there was no other sites of stenosis.  Thereafter full active passive range of motion of the IP joint was recovered.  The flexor tendon was in good repair.  The wound was repaired with intradermal 4-0 Prolene.  A Steri-Strips applied.  A compressive dressing of sterile gauze and Coban was applied. There were no apparent complications.     Katy Fitch Mickeal Daws, M.D.   ______________________________ Katy Fitch. Gee Habig, M.D.    RVS/MEDQ  D:  04/16/2013  T:  04/16/2013  Job:  [id number]  cc:   Gwen Pounds, MD

## 2013-04-16 NOTE — Op Note (Signed)
NAME:  Dondero, Khrystyne                ACCOUNT NO.:  [id number]  MEDICAL RECORD NO.:  [id number]  LOCATION:                               FACILITY:  [facility]  PHYSICIAN:  Junior Kenedy V. Jocelyn Lowery, M.D. DATE OF BIRTH:  12/30/1960  DATE OF PROCEDURE:  04/16/2013 DATE OF DISCHARGE:  04/16/2013                              OPERATIVE REPORT   PREOPERATIVE DIAGNOSIS:  Chronic stenosing tenosynovitis, left thumb at A1 pulley.  POSTOPERATIVE DIAGNOSIS:  Chronic stenosing tenosynovitis, left thumb at A1 pulley.  OPERATION:  Release of left thumb A1 pulley.  OPERATING SURGEON:  Demetri Goshert V. Murtaza Shell, MD.  ASSISTANT:  Surgical technician.  ANESTHESIA:  2% lidocaine, flexor sheath and metacarpal head level block supplemented by IV sedation, i.e. monitored anesthesia care.  SUPERVISING ANESTHESIOLOGIST:  Charles E. Frederick, MD.  INDICATION:  Congetta Roundy is a 51-year-old woman referred by Dr. Russo for evaluation and management of stenosing tenosynovitis of left thumb. She had a history of chronic triggering and pain.  She has not responded to nonoperative measures.  She is now brought to the operating room for definitive release of her left thumb A1 pulley.  Preoperatively, she was interviewed in the holding area.  Her proper surgical site was identified per protocol and marked with a marking pen. Questions were invited and answered in detail with Ms. Arrellano and her spouse.  After informed consent, she is brought to the operating room at this time.  PROCEDURE:  Cynda Capo was brought to room 2 of the [facility] and placed in supine position on the operating table.  Following induction of general anesthesia by LMA technique, the left hand and arm were prepped with Betadine soap and solution, sterilely draped.  A 2% lidocaine was infiltrated in the path intended incision and into the flexor sheath of the left thumb, total volume 2 mL was provided.  After exsanguination left arm with  Esmarch bandage, arterial tourniquet on proximal brachium inflated 250 mmHg due to mild systolic hypertension noted during sedation.  Following routine surgical time-out, procedure commenced with a short transverse incision directly over the palpably thickened A1 pulley. Subcutaneous tissues were carefully divided, taking care to identify the radial proper digital nerve and carefully place Ragnell retractors x3. The A1 pulley was split with scalpel scissors.  The tenotomy scissors were passed proximally and distally to be sure there was no other sites of stenosis.  Thereafter full active passive range of motion of the IP joint was recovered.  The flexor tendon was in good repair.  The wound was repaired with intradermal 4-0 Prolene.  A Steri-Strips applied.  A compressive dressing of sterile gauze and Coban was applied. There were no apparent complications.     Riona Lahti V. Tayte Childers, M.D.   ______________________________ Letisha Yera V. Rex Oesterle, M.D.    RVS/MEDQ  D:  04/16/2013  T:  04/16/2013  Job:  [id number]  cc:   John M Russo, MD 

## 2013-04-16 NOTE — Op Note (Signed)
[id number] 

## 2013-04-16 NOTE — Anesthesia Preprocedure Evaluation (Signed)
Anesthesia Evaluation  Patient identified by MRN, date of birth, ID band Patient awake    Reviewed: Allergy & Precautions, H&P , NPO status , Patient's Chart, lab work & pertinent test results, reviewed documented beta blocker date and time   Airway Mallampati: II TM Distance: >3 FB Neck ROM: full    Dental   Pulmonary COPD breath sounds clear to auscultation        Cardiovascular hypertension, Pt. on medications negative cardio ROS  Rhythm:regular     Neuro/Psych PSYCHIATRIC DISORDERS Depression negative neurological ROS  negative psych ROS   GI/Hepatic negative GI ROS, Neg liver ROS,   Endo/Other  negative endocrine ROS  Renal/GU negative Renal ROS  negative genitourinary   Musculoskeletal   Abdominal   Peds  Hematology negative hematology ROS (+)   Anesthesia Other Findings See surgeon's H&P   Reproductive/Obstetrics negative OB ROS                           Anesthesia Physical Anesthesia Plan  ASA: III  Anesthesia Plan: MAC   Post-op Pain Management:    Induction:   Airway Management Planned: Simple Face Mask  Additional Equipment:   Intra-op Plan:   Post-operative Plan:   Informed Consent: I have reviewed the patients History and Physical, chart, labs and discussed the procedure including the risks, benefits and alternatives for the proposed anesthesia with the patient or authorized representative who has indicated his/her understanding and acceptance.   Dental Advisory Given  Plan Discussed with: CRNA and Surgeon  Anesthesia Plan Comments:         Anesthesia Quick Evaluation

## 2013-04-16 NOTE — Brief Op Note (Signed)
04/16/2013  8:58 AM  PATIENT:  Natasha [name]  52 y.o. female  PRE-OPERATIVE DIAGNOSIS:  LEFT THUMB A1 STENOSING TENOSYNOVITIS  POST-OPERATIVE DIAGNOSIS:  LEFT THUMB A1 STENOSING TENOSYNOVITIS  PROCEDURE:  Procedure(s): RELEASE TRIGGER FINGER/A-1 PULLEY LEFT THUMB (Left)  SURGEON:  Surgeon(s) and Role:    * Wyn Forster., MD - Primary  PHYSICIAN ASSISTANT:   ASSISTANTS:  Surgical technician  ANESTHESIA:   MAC  EBL:     BLOOD ADMINISTERED:none  DRAINS: none   LOCAL MEDICATIONS USED:  LIDOCAINE   SPECIMEN:  No Specimen  DISPOSITION OF SPECIMEN:  N/A  COUNTS:  YES  TOURNIQUET:   Total Tourniquet Time Documented: Upper Arm (Left) - 6 minutes Total: Upper Arm (Left) - 6 minutes   DICTATION: .Other Dictation: Dictation Number [id number]  PLAN OF CARE: Discharge to home after PACU  PATIENT DISPOSITION:  PACU - hemodynamically stable.   Delay start of Pharmacological VTE agent (>24hrs) due to surgical blood loss or risk of bleeding: not applicable

## 2013-04-20 ENCOUNTER — Encounter (HOSPITAL_BASED_OUTPATIENT_CLINIC_OR_DEPARTMENT_OTHER): Payer: Self-pay | Admitting: Orthopedic Surgery

## 2013-05-18 ENCOUNTER — Other Ambulatory Visit (HOSPITAL_COMMUNITY): Payer: Self-pay | Admitting: Internal Medicine

## 2013-05-18 DIAGNOSIS — R05 Cough: Secondary | ICD-10-CM

## 2013-05-18 DIAGNOSIS — R059 Cough, unspecified: Secondary | ICD-10-CM

## 2013-06-02 ENCOUNTER — Encounter (HOSPITAL_COMMUNITY): Payer: BC Managed Care – PPO

## 2014-05-10 ENCOUNTER — Ambulatory Visit: Payer: BC Managed Care – PPO

## 2014-05-14 ENCOUNTER — Ambulatory Visit: Payer: BC Managed Care – PPO

## 2014-05-18 ENCOUNTER — Ambulatory Visit: Payer: BC Managed Care – PPO | Attending: Neurosurgery | Admitting: Physical Therapy

## 2014-05-18 DIAGNOSIS — M5412 Radiculopathy, cervical region: Secondary | ICD-10-CM | POA: Diagnosis not present

## 2014-05-18 DIAGNOSIS — M542 Cervicalgia: Secondary | ICD-10-CM | POA: Diagnosis not present

## 2014-05-21 ENCOUNTER — Ambulatory Visit: Payer: BC Managed Care – PPO | Admitting: Physical Therapy

## 2014-05-21 DIAGNOSIS — M5412 Radiculopathy, cervical region: Secondary | ICD-10-CM | POA: Diagnosis not present

## 2014-05-25 ENCOUNTER — Encounter: Payer: BC Managed Care – PPO | Admitting: Physical Therapy

## 2014-05-31 ENCOUNTER — Ambulatory Visit: Payer: BC Managed Care – PPO | Admitting: Physical Therapy

## 2014-05-31 DIAGNOSIS — M5412 Radiculopathy, cervical region: Secondary | ICD-10-CM | POA: Diagnosis not present

## 2014-06-03 ENCOUNTER — Ambulatory Visit: Payer: BC Managed Care – PPO | Attending: Neurosurgery | Admitting: Physical Therapy

## 2014-06-03 DIAGNOSIS — M542 Cervicalgia: Secondary | ICD-10-CM | POA: Diagnosis not present

## 2014-06-03 DIAGNOSIS — M5412 Radiculopathy, cervical region: Secondary | ICD-10-CM | POA: Diagnosis not present

## 2014-06-07 ENCOUNTER — Ambulatory Visit: Payer: BC Managed Care – PPO | Admitting: Physical Therapy

## 2014-06-09 ENCOUNTER — Encounter: Payer: BC Managed Care – PPO | Admitting: Physical Therapy

## 2015-03-02 ENCOUNTER — Other Ambulatory Visit (HOSPITAL_COMMUNITY): Payer: Self-pay | Admitting: Respiratory Therapy

## 2015-03-02 DIAGNOSIS — R05 Cough: Secondary | ICD-10-CM

## 2015-03-02 DIAGNOSIS — R059 Cough, unspecified: Secondary | ICD-10-CM

## 2015-03-18 ENCOUNTER — Inpatient Hospital Stay (HOSPITAL_COMMUNITY): Admission: RE | Admit: 2015-03-18 | Payer: Self-pay | Source: Ambulatory Visit

## 2015-06-10 ENCOUNTER — Ambulatory Visit
Admission: RE | Admit: 2015-06-10 | Discharge: 2015-06-10 | Disposition: A | Discharge status: Home / Self Care | Payer: BLUE CROSS/BLUE SHIELD | Source: Ambulatory Visit | Attending: Internal Medicine | Admitting: Internal Medicine

## 2015-06-10 ENCOUNTER — Other Ambulatory Visit: Payer: Self-pay | Admitting: Internal Medicine

## 2015-06-10 DIAGNOSIS — M5136 Other intervertebral disc degeneration, lumbar region: Secondary | ICD-10-CM

## 2015-07-01 ENCOUNTER — Observation Stay (HOSPITAL_COMMUNITY)
Admission: EM | Admit: 2015-07-01 | Discharge: 2015-07-02 | Disposition: A | Discharge status: Home / Self Care | Payer: BLUE CROSS/BLUE SHIELD | Attending: Internal Medicine | Admitting: Internal Medicine

## 2015-07-01 ENCOUNTER — Emergency Department (INDEPENDENT_AMBULATORY_CARE_PROVIDER_SITE_OTHER)
Admission: EM | Admit: 2015-07-01 | Discharge: 2015-07-01 | Payer: BLUE CROSS/BLUE SHIELD | Source: Home / Self Care | Attending: Family Medicine | Admitting: Family Medicine

## 2015-07-01 ENCOUNTER — Emergency Department (HOSPITAL_COMMUNITY): Payer: BLUE CROSS/BLUE SHIELD

## 2015-07-01 ENCOUNTER — Encounter (HOSPITAL_COMMUNITY): Payer: Self-pay | Admitting: Emergency Medicine

## 2015-07-01 DIAGNOSIS — Z6824 Body mass index (BMI) 24.0-24.9, adult: Secondary | ICD-10-CM | POA: Insufficient documentation

## 2015-07-01 DIAGNOSIS — R0789 Other chest pain: Secondary | ICD-10-CM

## 2015-07-01 DIAGNOSIS — Z7982 Long term (current) use of aspirin: Secondary | ICD-10-CM | POA: Diagnosis not present

## 2015-07-01 DIAGNOSIS — Z79899 Other long term (current) drug therapy: Secondary | ICD-10-CM | POA: Insufficient documentation

## 2015-07-01 DIAGNOSIS — F17210 Nicotine dependence, cigarettes, uncomplicated: Secondary | ICD-10-CM | POA: Insufficient documentation

## 2015-07-01 DIAGNOSIS — R05 Cough: Secondary | ICD-10-CM | POA: Insufficient documentation

## 2015-07-01 DIAGNOSIS — Z720 Tobacco use: Secondary | ICD-10-CM

## 2015-07-01 DIAGNOSIS — E669 Obesity, unspecified: Secondary | ICD-10-CM | POA: Diagnosis not present

## 2015-07-01 DIAGNOSIS — I10 Essential (primary) hypertension: Secondary | ICD-10-CM | POA: Diagnosis present

## 2015-07-01 DIAGNOSIS — Z791 Long term (current) use of non-steroidal anti-inflammatories (NSAID): Secondary | ICD-10-CM | POA: Insufficient documentation

## 2015-07-01 DIAGNOSIS — R079 Chest pain, unspecified: Principal | ICD-10-CM | POA: Insufficient documentation

## 2015-07-01 DIAGNOSIS — J449 Chronic obstructive pulmonary disease, unspecified: Secondary | ICD-10-CM | POA: Diagnosis not present

## 2015-07-01 DIAGNOSIS — F329 Major depressive disorder, single episode, unspecified: Secondary | ICD-10-CM | POA: Diagnosis not present

## 2015-07-01 HISTORY — DX: Other intervertebral disc degeneration, lumbosacral region: M51.37

## 2015-07-01 HISTORY — DX: Other intervertebral disc degeneration, lumbosacral region without mention of lumbar back pain or lower extremity pain: M51.379

## 2015-07-01 LAB — I-STAT TROPONIN, ED
Troponin i, poc: 0 ng/mL (ref 0.00–0.08)
Troponin i, poc: 0 ng/mL (ref 0.00–0.08)

## 2015-07-01 LAB — BASIC METABOLIC PANEL
Anion gap: 10 (ref 5–15)
BUN: 10 mg/dL (ref 6–20)
CO2: 26 mmol/L (ref 22–32)
Calcium: 9 mg/dL (ref 8.9–10.3)
Chloride: 104 mmol/L (ref 101–111)
Creatinine, Ser: 0.75 mg/dL (ref 0.44–1.00)
GFR calc Af Amer: >60 mL/min (ref >60)
GFR calc non Af Amer: >60 mL/min (ref >60)
Glucose, Bld: 76 mg/dL (ref 65–99)
Potassium: 3.6 mmol/L (ref 3.5–5.1)
Sodium: 140 mmol/L (ref 135–145)

## 2015-07-01 LAB — CBC
HCT: 39.6 % (ref 36.0–46.0)
Hemoglobin: 13.2 g/dL (ref 12.0–15.0)
MCH: 30.4 pg (ref 26.0–34.0)
MCHC: 33.3 g/dL (ref 30.0–36.0)
MCV: 91.2 fL (ref 78.0–100.0)
Platelets: 293 10*3/uL (ref 150–400)
RBC: 4.34 MIL/uL (ref 3.87–5.11)
RDW: 12.7 % (ref 11.5–15.5)
WBC: 6.9 10*3/uL (ref 4.0–10.5)

## 2015-07-01 LAB — D-DIMER, QUANTITATIVE: D-Dimer, Quant: <0.27 ug/mL-FEU (ref 0.00–0.50)

## 2015-07-01 MED ORDER — FAMOTIDINE 20 MG PO TABS
20.0000 mg | ORAL_TABLET | Freq: Every day | ORAL | Status: DC
  Administered 2015-07-02: 20 mg via ORAL
  Filled 2015-07-01: qty 1

## 2015-07-01 MED ORDER — ATORVASTATIN CALCIUM 10 MG PO TABS
10.0000 mg | ORAL_TABLET | Freq: Every day | ORAL | Status: DC
  Administered 2015-07-02: 10 mg via ORAL
  Filled 2015-07-01: qty 1

## 2015-07-01 MED ORDER — ENOXAPARIN SODIUM 40 MG/0.4ML ~~LOC~~ SOLN
40.0000 mg | Freq: Every day | SUBCUTANEOUS | Status: DC
  Administered 2015-07-02: 40 mg via SUBCUTANEOUS
  Filled 2015-07-01: qty 0.4

## 2015-07-01 MED ORDER — ASPIRIN 81 MG PO CHEW
324.0000 mg | CHEWABLE_TABLET | Freq: Once | ORAL | Status: AC
  Administered 2015-07-01: 324 mg via ORAL

## 2015-07-01 MED ORDER — MORPHINE SULFATE (PF) 2 MG/ML IV SOLN
2.0000 mg | INTRAVENOUS | Status: DC | PRN
  Administered 2015-07-02 (×2): 2 mg via INTRAVENOUS
  Filled 2015-07-01 (×2): qty 1

## 2015-07-01 MED ORDER — MELOXICAM 15 MG PO TABS
15.0000 mg | ORAL_TABLET | Freq: Every day | ORAL | Status: DC
  Administered 2015-07-02: 15 mg via ORAL
  Filled 2015-07-01: qty 1

## 2015-07-01 MED ORDER — IBUPROFEN 200 MG PO TABS: 200.0000 mg | ORAL_TABLET | Freq: Four times a day (QID) | ORAL | Status: DC | PRN

## 2015-07-01 MED ORDER — HYDRALAZINE HCL 20 MG/ML IJ SOLN: 5.0000 mg | INTRAMUSCULAR | Status: DC | PRN

## 2015-07-01 MED ORDER — MORPHINE SULFATE (PF) 4 MG/ML IV SOLN
4.0000 mg | Freq: Once | INTRAVENOUS | Status: AC
  Administered 2015-07-01: 4 mg via INTRAVENOUS
  Filled 2015-07-01: qty 1

## 2015-07-01 MED ORDER — ESCITALOPRAM OXALATE 10 MG PO TABS
10.0000 mg | ORAL_TABLET | Freq: Every day | ORAL | Status: DC
  Administered 2015-07-02: 10 mg via ORAL
  Filled 2015-07-01: qty 1

## 2015-07-01 MED ORDER — ONDANSETRON HCL 4 MG/2ML IJ SOLN: 4.0000 mg | Freq: Four times a day (QID) | INTRAMUSCULAR | Status: DC | PRN

## 2015-07-01 MED ORDER — ASPIRIN 81 MG PO CHEW: 81.0000 mg | CHEWABLE_TABLET | Freq: Every day | ORAL | Status: DC

## 2015-07-01 MED ORDER — BUPROPION HCL ER (SR) 150 MG PO TB12
150.0000 mg | ORAL_TABLET | Freq: Two times a day (BID) | ORAL | Status: DC
  Administered 2015-07-02 (×2): 150 mg via ORAL
  Filled 2015-07-01 (×2): qty 1

## 2015-07-01 MED ORDER — SODIUM CHLORIDE 0.9 % IV SOLN
Freq: Once | INTRAVENOUS | Status: AC
  Administered 2015-07-01: 17:00:00 via INTRAVENOUS

## 2015-07-01 MED ORDER — ASPIRIN 81 MG PO CHEW
CHEWABLE_TABLET | ORAL | Status: AC
  Filled 2015-07-01: qty 4

## 2015-07-01 MED ORDER — ASPIRIN 81 MG PO CHEW: 324.0000 mg | CHEWABLE_TABLET | Freq: Once | ORAL | Status: AC

## 2015-07-01 MED ORDER — ASPIRIN 81 MG PO CHEW
324.0000 mg | CHEWABLE_TABLET | Freq: Once | ORAL | Status: DC
Start: 2015-07-01 — End: 2015-07-02

## 2015-07-01 MED ORDER — HYDROCHLOROTHIAZIDE 25 MG PO TABS
12.5000 mg | ORAL_TABLET | Freq: Every day | ORAL | Status: DC
  Administered 2015-07-02: 12.5 mg via ORAL
  Filled 2015-07-01: qty 1

## 2015-07-01 MED ORDER — ACETAMINOPHEN 325 MG PO TABS
650.0000 mg | ORAL_TABLET | ORAL | Status: DC | PRN
  Administered 2015-07-02: 650 mg via ORAL
  Filled 2015-07-01: qty 2

## 2015-07-01 MED ORDER — GI COCKTAIL ~~LOC~~
30.0000 mL | Freq: Once | ORAL | Status: AC
Start: 2015-07-01 — End: 2015-07-01
  Administered 2015-07-01: 30 mL via ORAL
  Filled 2015-07-01: qty 30

## 2015-07-01 MED ORDER — BISOPROLOL-HYDROCHLOROTHIAZIDE 5-6.25 MG PO TABS
1.0000 | ORAL_TABLET | Freq: Every day | ORAL | Status: DC
  Administered 2015-07-02: 1 via ORAL
  Filled 2015-07-01: qty 1

## 2015-07-01 NOTE — ED Notes (Signed)
Pt arrives via [facility] from urgent care reporting midsternal CP, some tingling and numbness to LUE past few days. Pt reports recent cough.  Pt denies N/V, diaphoresis, dizziness. NAD noted at this time.  Resp e/u.

## 2015-07-01 NOTE — H&P (Signed)
[facility] History and Physical  ELLINA SIVERTSEN ZOX:[id number] DOB: 05/06/1961 DOA: 07/01/2015  Referring physician: EDP PCP: Gwen Pounds, MD   Chief Complaint: Chest pain   HPI: Natasha [name] is a 54 y.o. female who presents to the ED with several day history of intermittent chest pain.  Today pain has been present since around lunch time.  Pain is located in central chest, has moved sub sternally.  Last few days has also been associated with mid back pain (today anterior only).  No exertional component, no specific aggravating or relieving factors.  Review of Systems: Systems reviewed.  As above, otherwise negative  Past Medical History  Diagnosis Date  . Hemorrhoids   . Arthritis   . Hypertension   . DDD (degenerative disc disease)   . Depression   . COPD (chronic obstructive pulmonary disease) (HCC)   . Vitamin D deficiency    Past Surgical History  Procedure Laterality Date  . Vaginal hysterectomy  06/2000    partial, Dr. Jeanine Luz  . Cervical disc surgery  12/2010    Dr. Franky Macho  . Tonsillectomy      as teenager, in [place]  . Mouth surgery    . Trigger finger release Left 04/16/2013    Procedure: RELEASE TRIGGER FINGER/A-1 PULLEY LEFT THUMB;  Surgeon: Wyn Forster., MD;  Location: [facility];  Service: Orthopedics;  Laterality: Left;   Social History:  reports that she has been smoking Cigarettes.  She has a 35 pack-year smoking history. She has never used smokeless tobacco. She reports that she drinks alcohol. She reports that she does not use illicit drugs.  No Known Allergies  Family History  Problem Relation Age of Onset  . Diabetes    . Colitis       Prior to Admission medications   Medication Sig Start Date End Date Taking? Authorizing Provider  aspirin 81 MG tablet Take 81 mg by mouth at bedtime.    Yes Historical Provider, MD  atorvastatin (LIPITOR) 10 MG tablet Take 10 mg by mouth at bedtime. 06/24/15  Yes Historical  Provider, MD  bisoprolol-hydrochlorothiazide (ZIAC) 5-6.25 MG per tablet Take 1 tablet by mouth daily.   Yes Historical Provider, MD  Boswellia-Glucosamine-Vit D (GLUCOSAMINE COMPLEX PO) Take 1 tablet by mouth daily.   Yes Historical Provider, MD  buPROPion (WELLBUTRIN SR) 150 MG 12 hr tablet Take 150 mg by mouth 2 (two) times daily. 05/31/15  Yes Historical Provider, MD  escitalopram (LEXAPRO) 5 MG tablet Take 10 mg by mouth at bedtime.    Yes Historical Provider, MD  hydrochlorothiazide (HYDRODIURIL) 12.5 MG tablet Take 12.5 mg by mouth daily. 06/29/15  Yes Historical Provider, MD  ibuprofen (ADVIL,MOTRIN) 200 MG tablet Take 200 mg by mouth every 6 (six) hours as needed for mild pain or moderate pain.   Yes Historical Provider, MD  meloxicam (MOBIC) 15 MG tablet Take 15 mg by mouth daily. 07/01/15  Yes Historical Provider, MD  MULTIPLE VITAMIN PO Take 1 tablet by mouth daily.    Yes Historical Provider, MD  pseudoephedrine (SUDAFED) 30 MG tablet Take 30 mg by mouth every 4 (four) hours as needed for congestion.   Yes Historical Provider, MD  ranitidine (ZANTAC) 150 MG tablet Take 150 mg by mouth daily as needed for heartburn.   Yes Historical Provider, MD   Physical Exam: Filed Vitals:   07/01/15 2100 07/01/15 2200  BP: 175/97 185/96  Pulse: 72 69  Temp:  Resp: 18 16    BP 185/96 mmHg  Pulse 69  Temp(Src) 98.1 F (36.7 C) (Oral)  Resp 16  Ht  (1.778 m)  Wt 77.111 kg (170 lb)  BMI 24.39 kg/m2  SpO2 96%  General Appearance:    Alert, oriented, no distress, appears stated age  Head:    Normocephalic, atraumatic  Eyes:    PERRL, EOMI, sclera non-icteric        Nose:   Nares without drainage or epistaxis. Mucosa, turbinates normal  Throat:   Moist mucous membranes. Oropharynx without erythema or exudate.  Neck:   Supple. No carotid bruits.  No thyromegaly.  No lymphadenopathy.   Back:     No CVA tenderness, no spinal tenderness  Lungs:     Clear to auscultation  bilaterally, without wheezes, rhonchi or rales  Chest wall:    No tenderness to palpitation  Heart:    Regular rate and rhythm without murmurs, gallops, rubs  Abdomen:     Soft, non-tender, nondistended, normal bowel sounds, no organomegaly  Genitalia:    deferred  Rectal:    deferred  Extremities:   No clubbing, cyanosis or edema.  Pulses:   2+ and symmetric all extremities  Skin:   Skin color, texture, turgor normal, no rashes or lesions  Lymph nodes:   Cervical, supraclavicular, and axillary nodes normal  Neurologic:   CNII-XII intact. Normal strength, sensation and reflexes      throughout    Labs on Admission:  Basic Metabolic Panel:  Recent Labs Lab 07/01/15 1816  NA 140  K 3.6  CL 104  CO2 26  GLUCOSE 76  BUN 10  CREATININE 0.75  CALCIUM 9.0   Liver Function Tests: No results for input(s): AST, ALT, ALKPHOS, BILITOT, PROT, ALBUMIN in the last 168 hours. No results for input(s): LIPASE, AMYLASE in the last 168 hours. No results for input(s): AMMONIA in the last 168 hours. CBC:  Recent Labs Lab 07/01/15 1816  WBC 6.9  HGB 13.2  HCT 39.6  MCV 91.2  PLT 293   Cardiac Enzymes: No results for input(s): CKTOTAL, CKMB, CKMBINDEX, TROPONINI in the last 168 hours.  BNP (last 3 results) No results for input(s): PROBNP in the last 8760 hours. CBG: No results for input(s): GLUCAP in the last 168 hours.  Radiological Exams on Admission: Dg Chest 2 View  07/01/2015  CLINICAL DATA:  54 year old female with chest pain EXAM: CHEST  2 VIEW COMPARISON:  None. FINDINGS: The heart size and mediastinal contours are within normal limits. Both lungs are clear. The visualized skeletal structures are unremarkable. IMPRESSION: No active cardiopulmonary disease. Electronically Signed   By: Elgie Collard M.D.   On: 07/01/2015 19:33    EKG: Independently reviewed.  Assessment/Plan Principal Problem:   Chest pain with low risk for cardiac etiology Active Problems:   HTN  (hypertension)   1. Chest pain with low risk for cardiac etiology - HEART score of 3 with GERD like features 1. EDP is uncomfortable discharging patient for outpatient follow up so will admit for observation 2. Chest pain obs 3. Dissection and PE unlikely with negative trop and negative dimer 4. Serial trops 5. NPO after midnight 2. HTN - 1. Continue home meds 2. Holding sudafed which is possibly why her BP is so elevated today 3. Add PRN hydralazine if needed.    Code Status: Full  Family Communication: Husband at bedside Disposition Plan: Admit to obs   Time spent: 30 min  GARDNER,  JARED M. [facility] Pager [phone]  If 7AM-7PM, please contact the day team taking care of the patient [email or web address] Password [email or web address] 07/01/2015, 11:31 PM

## 2015-07-01 NOTE — ED Provider Notes (Signed)
CSN: [id number]     Arrival date & time 07/01/15  1457 History   First MD Initiated Contact with Patient 07/01/15 1546     Chief Complaint  Patient presents with  . Hypertension  . Abdominal Pain   (Consider location/radiation/quality/duration/timing/severity/associated sxs/prior Treatment) HPI Comments: 54 year old female presents to the urgent care with concerns of elevated blood pressure. She has a known history of hypertension and is taking any hypertensives. She was in an orthopedist office today to discuss treatment for chronic back pain when they were taking her blood pressure that measured in the high 100s systolic and in the 110 to 115 diastolic. She was advised to be evaluated for her elevated blood pressure and was sent to the urgent care after she was unable to get in to see her PCP. Her blood pressures in the urgent care were 176/85 and 182/98 manual. In addition she is complaining of midsternal chest discomfort described as a heavy feeling that started today. It is constant. Nothing seems to make it worse, nothing makes it better. In addition to her history of hypertension she smokes approximately one pack per day and has been smoking for 35 years. She has a history of COPD. There has been no vomiting or diaphoresis today. Denies URI symptoms or fever.   Past Medical History  Diagnosis Date  . Hemorrhoids   . Arthritis   . Hypertension   . DDD (degenerative disc disease)   . Depression   . COPD (chronic obstructive pulmonary disease) (HCC)   . Vitamin D deficiency    Past Surgical History  Procedure Laterality Date  . Vaginal hysterectomy  06/2000    partial, Dr. Jeanine LuzSteven Fore  . Cervical disc surgery  12/2010    Dr. Franky Machoabbell  . [place]      [place] teenager, [place]  . Mouth surgery    . Trigger finger release Left 04/16/2013    Procedure: RELEASE TRIGGER FINGER/A-1 PULLEY LEFT THUMB;  Surgeon: Wyn Forsterobert V Sypher Jr., MD;  Location: [facility];  Service:  Orthopedics;  Laterality: Left;   Family History  Problem Relation Age of Onset  . Diabetes    . Colitis     Social History  Substance Use Topics  . Smoking status: Current Some Day Smoker -- 1.00 packs/day for 35 years    Types: Cigarettes  . Smokeless tobacco: Never Used  . Alcohol Use: Yes     Comment: occ   OB History    No data available     Review of Systems  Constitutional: Negative for fever, activity change and fatigue.  Respiratory: Positive for cough and shortness of breath.   Cardiovascular: Positive for chest pain. Negative for leg swelling.  Gastrointestinal: Negative.   Genitourinary: Negative.   Musculoskeletal: Negative.   Skin: Negative.   Neurological: Positive for headaches. Negative for dizziness, tremors, speech difficulty and numbness.  All other systems reviewed and are negative.   Allergies  Review of patient's allergies indicates no known allergies.  Home Medications   Prior to Admission medications   Medication Sig Start Date End Date Taking? Authorizing Provider  escitalopram (LEXAPRO) 5 MG tablet Take 5 mg by mouth daily.   Yes Historical Provider, MD  acetaminophen-codeine (TYLENOL #3) 300-30 MG per tablet Take 1 tablet by mouth every 4 (four) hours as needed for pain. 04/16/13   Josephine Igoobert Sypher, MD  aspirin 81 MG tablet Take 81 mg by mouth daily.    Historical Provider, MD  bisoprolol-hydrochlorothiazide [facility](ZIAC)  5-6.25 MG per tablet Take 1 tablet by mouth daily.    Historical Provider, MD  BuPROPion HCl (WELLBUTRIN PO) Take 150 mg by mouth every morning. 1 - 2 tabs every day    Historical Provider, MD  Calcium Carbonate (CALTRATE 600 PO) Take by mouth daily. When remembers    Historical Provider, MD  losartan (COZAAR) 25 MG tablet Take 25 mg by mouth daily.    Historical Provider, MD  MULTIPLE VITAMIN PO Take by mouth.    Historical Provider, MD  Naproxen Sodium (ALEVE PO) Take by mouth as needed.    Historical Provider, MD   Meds Ordered  and Administered this Visit   Medications  0.9 %  sodium chloride infusion (not administered)  aspirin chewable tablet 324 mg (not administered)    BP 182/98 mmHg  Pulse 79  Temp(Src) 98 F (36.7 C) (Oral)  Resp 16  SpO2 99% No data found.   Physical Exam  Constitutional: She is oriented to person, place, and time. She appears well-developed and well-nourished. No distress.  HENT:  Head: Normocephalic and atraumatic.  Mouth/Throat: Oropharynx is clear and moist. No oropharyngeal exudate.  Eyes: EOM are normal.  Neck: Normal range of motion. Neck supple.  Cardiovascular: Normal rate, regular rhythm and normal heart sounds.   Pulmonary/Chest: Effort normal. No respiratory distress. She has no rales. She exhibits no tenderness.  Distant wheezes bilaterally with forceful expiration.  Abdominal: Soft.  Musculoskeletal: Normal range of motion.  Neurological: She is alert and oriented to person, place, and time. No cranial nerve deficit.  Skin: Skin is warm and dry.  Psychiatric: She has a normal mood and affect.  Nursing note and vitals reviewed.   ED Course  Procedures (including critical care time)  Labs Review Labs Reviewed - No data to display  Imaging Review No results found. ED ECG REPORT   Date: 07/01/2015  Rate: 77  Rhythm: normal sinus rhythm  QRS Axis: normal  Intervals: normal  ST/T Wave abnormalities: normal  Conduction Disutrbances:none  Narrative Interpretation:   Old EKG Reviewed: none available  I have personally reviewed the EKG tracing and agree with the computerized printout as noted.    Visual Acuity Review  Right Eye Distance:   Left Eye Distance:   Bilateral Distance:    Right Eye Near:   Left Eye Near:    Bilateral Near:         MDM   1. Atypical chest pain   2. Essential hypertension   3. Tobacco abuse disorder    54 year old female with a 35-pack-year history of smoking as well as a history of hypertension developed    substernal chest pressure today in addition to elevated blood pressure readings while in the orthopedist office. She is being transferred to the emergency department for evaluation of this chest pain .  Hayden Rasmussen, NP 07/01/15 [phone]

## 2015-07-01 NOTE — ED Notes (Signed)
Notified lab to please add on D-dimer.

## 2015-07-01 NOTE — ED Notes (Signed)
Patient went to orthopedic office for appt in regards to her back.  Orthopedic office had concerns about blood pressure readings being elevated.  Patient reports center, epigastric pain, traveling up chest towards throat.  Describes pain as "ache".  No nausea, no vomiting.  Patient has had worsening heart burn for 2 weeks.  When asked about diaphoresis, patient reported having what she thought was "hot flashes".  Patient says she often feels sob, "for years" and thought to be due to "smoking".

## 2015-07-01 NOTE — ED Provider Notes (Signed)
CSN: [id number]     Arrival date & time 07/01/15  1740 History   First MD Initiated Contact with Patient 07/01/15 1743     Chief Complaint  Patient presents with  . Chest Pain     (Consider location/radiation/quality/duration/timing/severity/associated sxs/prior Treatment) HPI Comments: PT comes in with cc of chest pain. PT has hx of HTN, 40+ pack year smoking hx. She reports that she has been having off and on chest discomfort, similar to GERD for the past few days. Her symptoms are intermittent. On occasion she has also noted L sided tingling, and numbness. Pt also had back pain in the scapular region. Her symptoms are more constant today - but they are localized to the anterior side only. Pain is not exertional, there is no specific aggravating or relieving factors. No cough.   ROS 10 Systems reviewed and are negative for acute change except as noted in the HPI.     Patient is a 54 y.o. female presenting with chest pain. The history is provided by the patient.  Chest Pain   Past Medical History  Diagnosis Date  . Hemorrhoids   . Arthritis   . Hypertension   . DDD (degenerative disc disease)   . Depression   . COPD (chronic obstructive pulmonary disease) (HCC)   . Vitamin D deficiency   . DDD (degenerative disc disease), lumbosacral    Past Surgical History  Procedure Laterality Date  . Vaginal hysterectomy  06/2000    partial, Dr. Jeanine Luz  . Cervical disc surgery  12/2010    Dr. Franky Macho  . Tonsillectomy      as teenager, in [place]  . Mouth surgery    . Trigger finger release Left 04/16/2013    Procedure: RELEASE TRIGGER FINGER/A-1 PULLEY LEFT THUMB;  Surgeon: Wyn Forster., MD;  Location: [facility];  Service: Orthopedics;  Laterality: Left;   Family History  Problem Relation Age of Onset  . Diabetes    . Colitis     Social History  Substance Use Topics  . Smoking status: Current Some Day Smoker -- 1.00 packs/day for 35 years   Types: Cigarettes  . Smokeless tobacco: Never Used  . Alcohol Use: Yes   OB History    No data available     Review of Systems  Cardiovascular: Positive for chest pain.      Allergies  Review of patient's allergies indicates no known allergies.  Home Medications   Prior to Admission medications   Medication Sig Start Date End Date Taking? Authorizing Provider  aspirin 81 MG tablet Take 81 mg by mouth at bedtime.    Yes Historical Provider, MD  atorvastatin (LIPITOR) 10 MG tablet Take 10 mg by mouth at bedtime. 06/24/15  Yes Historical Provider, MD  bisoprolol-hydrochlorothiazide (ZIAC) 5-6.25 MG per tablet Take 1 tablet by mouth daily.   Yes Historical Provider, MD  Boswellia-Glucosamine-Vit D (GLUCOSAMINE COMPLEX PO) Take 1 tablet by mouth daily.   Yes Historical Provider, MD  buPROPion (WELLBUTRIN SR) 150 MG 12 hr tablet Take 150 mg by mouth 2 (two) times daily. 05/31/15  Yes Historical Provider, MD  escitalopram (LEXAPRO) 5 MG tablet Take 10 mg by mouth at bedtime.    Yes Historical Provider, MD  hydrochlorothiazide (HYDRODIURIL) 12.5 MG tablet Take 12.5 mg by mouth daily. 06/29/15  Yes Historical Provider, MD  ibuprofen (ADVIL,MOTRIN) 200 MG tablet Take 200 mg by mouth every 6 (six) hours as needed for mild pain or moderate pain.  Yes Historical Provider, MD  meloxicam (MOBIC) 15 MG tablet Take 15 mg by mouth daily. 07/01/15  Yes Historical Provider, MD  MULTIPLE VITAMIN PO Take 1 tablet by mouth daily.    Yes Historical Provider, MD  pseudoephedrine (SUDAFED) 30 MG tablet Take 30 mg by mouth every 4 (four) hours as needed for congestion.   Yes Historical Provider, MD  ranitidine (ZANTAC) 150 MG tablet Take 150 mg by mouth daily as needed for heartburn.   Yes Historical Provider, MD   BP 164/98 mmHg  Pulse 65  Temp(Src) 97.4 F (36.3 C) (Oral)  Resp 16  Ht 5\' 10"  (1.778 m)  Wt 171 lb 9.6 oz (77.837 kg)  BMI 24.62 kg/m2  SpO2 95% Physical Exam  Constitutional: She is  oriented to person, place, and time. She appears well-developed.  HENT:  Head: Normocephalic and atraumatic.  Eyes: Conjunctivae and EOM are normal. Pupils are equal, round, and reactive to light.  Neck: Normal range of motion. Neck supple.  Cardiovascular: Normal rate, regular rhythm, normal heart sounds and intact distal pulses.   No murmur heard. Pulmonary/Chest: Effort normal and breath sounds normal. No respiratory distress.  Abdominal: Soft. Bowel sounds are normal. She exhibits no distension. There is no tenderness. There is no rebound and no guarding.  Neurological: She is alert and oriented to person, place, and time.  Skin: Skin is warm and dry.  Nursing note and vitals reviewed.   ED Course  Procedures (including critical care time) Labs Review Labs Reviewed  BASIC METABOLIC PANEL  CBC  D-DIMER, QUANTITATIVE (NOT AT [facility])  TROPONIN I  TROPONIN I  TROPONIN I  I-STAT TROPOININ, ED  Rosezena SensorI-STAT TROPOININ, ED    Imaging Review Dg Chest 2 View  07/01/2015  CLINICAL DATA:  54 year old female with chest pain EXAM: CHEST  2 VIEW COMPARISON:  None. FINDINGS: The heart size and mediastinal contours are within normal limits. Both lungs are clear. The visualized skeletal structures are unremarkable. IMPRESSION: No active cardiopulmonary disease. Electronically Signed   By: Elgie CollardArash  Radparvar M.D.   On: 07/01/2015 19:33   I have personally reviewed and evaluated these images and lab results as part of my medical decision-making.   EKG Interpretation   Date/Time:  Friday July 01 2015 17:46:22 EST Ventricular Rate:  68 PR Interval:  140 QRS Duration: 87 QT Interval:  411 QTC Calculation: 437 R Axis:   80 Text Interpretation:  Sinus rhythm Consider left atrial enlargement  Anteroseptal infarct, age indeterminate No significant change since last  tracing Confirmed by Rhunette CroftNANAVATI, MD, Janey GentaANKIT [phone] on 07/01/2015 6:44:50 PM      MDM   Final diagnoses:  None    Differential  diagnosis includes: ACS syndrome Dissection Myocarditis Pericarditis Pericardial effusion Pneumonia Pleural effusion Pulmonary edema PE Musculoskeletal pain  Pt comes in with cc of chest pain. Pain has some GERD like features - but she reports that the pain is present even when she is not eating and has been present all day today. + nausea and belching, despite no po intake today. In addition, she has these vague associated complains -numbness to the LUE, back pain -and has heavy smoking hx. Chest pain is described as pressure type pain. Our differential is ACS, dissection and PUD.  Dimer ordered to screen for dissection. Trops x 2 ordered. If trops neg, pt's HEAR score is 3, and we think it will be best for pt to get outpatient workup with Cards.  LATE ENTRY: Pt given GI cocktail. morhpine -  still has pain. Pain has migrated to the L side, under her breast. Still not pleuritic, not reproducible with palpation. Although GI etiology is possible, she has been npo here the entire time and still having constant pain and hasnt responded to any meds -will admit for serial trops and hopefully cards eval if the pain persists.   Derwood Kaplan, MD 07/02/15 [phone]

## 2015-07-02 ENCOUNTER — Encounter (HOSPITAL_COMMUNITY): Payer: Self-pay | Admitting: *Deleted

## 2015-07-02 DIAGNOSIS — F17200 Nicotine dependence, unspecified, uncomplicated: Secondary | ICD-10-CM

## 2015-07-02 DIAGNOSIS — R079 Chest pain, unspecified: Secondary | ICD-10-CM | POA: Diagnosis not present

## 2015-07-02 DIAGNOSIS — I10 Essential (primary) hypertension: Secondary | ICD-10-CM | POA: Diagnosis not present

## 2015-07-02 DIAGNOSIS — E669 Obesity, unspecified: Secondary | ICD-10-CM | POA: Diagnosis not present

## 2015-07-02 LAB — TROPONIN I
Troponin I: <0.03 ng/mL (ref <0.031)
Troponin I: <0.03 ng/mL (ref <0.031)
Troponin I: <0.03 ng/mL (ref <0.031)

## 2015-07-02 MED ORDER — NITROGLYCERIN 0.3 MG SL SUBL: 0.3000 mg | SUBLINGUAL_TABLET | SUBLINGUAL | Status: DC | PRN

## 2015-07-02 MED ORDER — TRAMADOL HCL 50 MG PO TABS: 50.0000 mg | ORAL_TABLET | Freq: Four times a day (QID) | ORAL | Status: DC

## 2015-07-02 MED ORDER — OMEPRAZOLE 40 MG PO CPDR: 40.0000 mg | DELAYED_RELEASE_CAPSULE | Freq: Two times a day (BID) | ORAL | Status: DC

## 2015-07-02 MED ORDER — GI COCKTAIL ~~LOC~~
30.0000 mL | Freq: Once | ORAL | Status: AC
  Administered 2015-07-02: 30 mL via ORAL
  Filled 2015-07-02: qty 30

## 2015-07-02 MED ORDER — DEXTROMETHORPHAN POLISTIREX ER 30 MG/5ML PO SUER
30.0000 mg | Freq: Two times a day (BID) | ORAL | Status: DC
Start: 2015-07-02 — End: 2015-09-30

## 2015-07-02 NOTE — Progress Notes (Signed)
Utilization Review Completed.Natasha [name] T12/31/2016  

## 2015-07-02 NOTE — Discharge Summary (Signed)
Physician Discharge Summary  Natasha [name]:[id number] DOB: 12/12/1960 DOA: 07/01/2015  PCP: Gwen Pounds, MD  Admit date: 07/01/2015 Discharge date: 07/02/2015  Time spent: 45 minutes  Recommendations for Outpatient Follow-up:  1. Stop smoking  2. F/u BP in 2 wks  Discharge Condition: stable    Discharge Diagnoses:  Principal Problem:   Chest pain with low risk for cardiac etiology Active Problems:   HTN (hypertension)  Nicotine abuse Obesity   History of present illness:  54 y/o female with HTN who is a smoker presents for chest pain. She reports to me that the pain comes and goes and has no associated symptoms. It is not related to exertion or eating. She has been coughing quite a bit and it may be related to this. She noted that her BP was quite high yesterday as well. She admits to using 2 Sudafed yesterday for what she thought was a sinus headache.   Hospital Course:  Chest pain - negative cardiac enzymes- non-acute EKG - possibly GI related vs musculoskeletal from cough - have recommended to stop smoking, stop NSAIDs which she uses for her arthritis, start BID PPI and see if her pain improves- OK to continue Zantac as well  - if pain does not improve with above measures, she is to f/u with her PCP for further work up [employer] did not help her with the pain yesterday but I have prescribed her Nitro.  HTN - advised to stop Sudafed - f/u with PCP for further adjustment of medications   Nicotine abuse/ Cough  - advised to stop smoking- she has nicotine patches at home which she will start using  Procedures:  none (i.e. Studies not automatically included, echos, thoracentesis, etc; not x-rays)  Consultations:  none  Discharge Exam: Filed Weights   07/01/15 1746 07/02/15 0059  Weight: 77.111 kg (170 lb) 77.837 kg (171 lb 9.6 oz)   Filed Vitals:   07/02/15 0059 07/02/15 0300  BP: 164/98 126/65  Pulse: 65 65  Temp: 97.4 F (36.3 C) 97.8 F (36.6 C)   Resp: 16 16    General: AAO x 3, no distress Cardiovascular: RRR, no murmurs  Respiratory: clear to auscultation bilaterally GI: soft, non-tender, non-distended, bowel sound positive  Discharge Instructions You were cared for by a hospitalist during your hospital stay. If you have any questions about your discharge medications or the care you received while you were in the hospital after you are discharged, you can call the unit and asked to speak with the hospitalist on call if the hospitalist that took care of you is not available. Once you are discharged, your primary care physician will handle any further medical issues. Please note that NO REFILLS for any discharge medications will be authorized once you are discharged, as it is imperative that you return to your primary care physician (or establish a relationship with a primary care physician if you do not have one) for your aftercare needs so that they can reassess your need for medications and monitor your lab values.  Discharge Instructions    Diet - low sodium heart healthy    Complete by:  As directed      Discharge instructions    Complete by:  As directed   Do not smoke, no oily or spicy food, no caffeine Avoid Ibuprofen and Alleve to see if your chest pain resolves.  If your still have pain after these measures, you need to see your doctor  Increase activity slowly    Complete by:  As directed             Medication List    STOP taking these medications        ibuprofen 200 MG tablet  Commonly known as:  ADVIL,MOTRIN     meloxicam 15 MG tablet  Commonly known as:  MOBIC     pseudoephedrine 30 MG tablet  Commonly known as:  SUDAFED      TAKE these medications        aspirin 81 MG tablet  Take 81 mg by mouth at bedtime.     atorvastatin 10 MG tablet  Commonly known as:  LIPITOR  Take 10 mg by mouth at bedtime.     bisoprolol-hydrochlorothiazide 5-6.25 MG tablet  Commonly known as:  ZIAC  Take 1  tablet by mouth daily.     buPROPion 150 MG 12 hr tablet  Commonly known as:  WELLBUTRIN SR  Take 150 mg by mouth 2 (two) times daily.     dextromethorphan 30 MG/5ML liquid  Commonly known as:  DELSYM  Take 5 mLs (30 mg total) by mouth 2 (two) times daily.     escitalopram 5 MG tablet  Commonly known as:  LEXAPRO  Take 10 mg by mouth at bedtime.     GLUCOSAMINE COMPLEX PO  Take 1 tablet by mouth daily.     hydrochlorothiazide 12.5 MG tablet  Commonly known as:  HYDRODIURIL  Take 12.5 mg by mouth daily.     MULTIPLE VITAMIN PO  Take 1 tablet by mouth daily.     nitroGLYCERIN 0.3 MG SL tablet  Commonly known as:  NITROSTAT  Place 1 tablet (0.3 mg total) under the tongue every 5 (five) minutes as needed for chest pain.     omeprazole 40 MG capsule  Commonly known as:  PRILOSEC  Take 1 capsule (40 mg total) by mouth 2 (two) times daily.     ranitidine 150 MG tablet  Commonly known as:  ZANTAC  Take 150 mg by mouth daily as needed for heartburn.     traMADol 50 MG tablet  Commonly known as:  ULTRAM  Take 1 tablet (50 mg total) by mouth 4 (four) times daily.       No Known Allergies    The results of significant diagnostics from this hospitalization (including imaging, microbiology, ancillary and laboratory) are listed below for reference.    Significant Diagnostic Studies: Dg Chest 2 View  07/01/2015  CLINICAL DATA:  54 year old female with chest pain EXAM: CHEST  2 VIEW COMPARISON:  None. FINDINGS: The heart size and mediastinal contours are within normal limits. Both lungs are clear. The visualized skeletal structures are unremarkable. IMPRESSION: No active cardiopulmonary disease. Electronically Signed   By: Elgie Collard M.D.   On: 07/01/2015 19:33   Dg Lumbar Spine Complete  06/10/2015  CLINICAL DATA:  Chronic lumbar back pain. EXAM: LUMBAR SPINE - COMPLETE 4+ VIEW COMPARISON:  10/04/2011 FINDINGS: There is no evidence of lumbar spine fracture. Alignment is  normal. Moderate degenerative disc disease and bilateral facet DJD seen at L5-S1, without significant change compared to prior study. IMPRESSION: No acute findings.  Degenerative spondylosis, as described above. Electronically Signed   By: Myles Rosenthal M.D.   On: 06/10/2015 16:34    Microbiology: No results found for this or any previous visit (from the past 240 hour(s)).   Labs: Basic Metabolic Panel:  Recent Labs Lab 07/01/15 1816  NA 140  K 3.6  CL 104  CO2 26  GLUCOSE 76  BUN 10  CREATININE 0.75  CALCIUM 9.0   Liver Function Tests: No results for input(s): AST, ALT, ALKPHOS, BILITOT, PROT, ALBUMIN in the last 168 hours. No results for input(s): LIPASE, AMYLASE in the last 168 hours. No results for input(s): AMMONIA in the last 168 hours. CBC:  Recent Labs Lab 07/01/15 1816  WBC 6.9  HGB 13.2  HCT 39.6  MCV 91.2  PLT 293   Cardiac Enzymes:  Recent Labs Lab 07/01/15 2351 07/02/15 0251 07/02/15 0548  TROPONINI <0.03 <0.03 <0.03   BNP: BNP (last 3 results) No results for input(s): BNP in the last 8760 hours.  ProBNP (last 3 results) No results for input(s): PROBNP in the last 8760 hours.  CBG: No results for input(s): GLUCAP in the last 168 hours.     SignedCalvert Cantor:  Rida Loudin, MD [facility] 07/02/2015, 8:07 AM

## 2015-07-02 NOTE — Progress Notes (Signed)
Pt discharged home this pm in stable condition. Discharge education provided with no concerns voiced. Pt has her car in parking lot

## 2015-07-02 NOTE — Progress Notes (Signed)
MD paged as pt is c/o left sided chest pain going below her left breast

## 2015-07-02 NOTE — Progress Notes (Signed)
GI cocktail administered as ordered.

## 2015-07-02 NOTE — Progress Notes (Signed)
Pt reports feeling better after taking GI cocktail

## 2015-07-02 NOTE — Progress Notes (Signed)
650mg po tylenol administered for c/o headache 

## 2015-07-18 ENCOUNTER — Telehealth: Payer: Self-pay | Admitting: Internal Medicine

## 2015-07-18 NOTE — Telephone Encounter (Signed)
[facility] on 08/10/15 with Dr Rennis GoldenHilty.  [facility]/10/15. lp

## 2015-07-22 ENCOUNTER — Ambulatory Visit: Payer: BLUE CROSS/BLUE SHIELD | Admitting: Cardiology

## 2015-08-10 ENCOUNTER — Encounter: Payer: Self-pay | Admitting: Internal Medicine

## 2015-08-10 ENCOUNTER — Ambulatory Visit (INDEPENDENT_AMBULATORY_CARE_PROVIDER_SITE_OTHER): Payer: BLUE CROSS/BLUE SHIELD | Admitting: Internal Medicine

## 2015-08-10 VITALS — BP 118/72 | HR 70 | Ht 69.0 in | Wt 176.0 lb

## 2015-08-10 DIAGNOSIS — R0602 Shortness of breath: Secondary | ICD-10-CM | POA: Diagnosis not present

## 2015-08-10 DIAGNOSIS — Z720 Tobacco use: Secondary | ICD-10-CM

## 2015-08-10 DIAGNOSIS — R079 Chest pain, unspecified: Secondary | ICD-10-CM | POA: Diagnosis not present

## 2015-08-10 DIAGNOSIS — R0789 Other chest pain: Secondary | ICD-10-CM

## 2015-08-10 DIAGNOSIS — R0609 Other forms of dyspnea: Secondary | ICD-10-CM | POA: Insufficient documentation

## 2015-08-10 DIAGNOSIS — I10 Essential (primary) hypertension: Secondary | ICD-10-CM | POA: Diagnosis not present

## 2015-08-10 DIAGNOSIS — E785 Hyperlipidemia, unspecified: Secondary | ICD-10-CM

## 2015-08-10 DIAGNOSIS — J42 Unspecified chronic bronchitis: Secondary | ICD-10-CM

## 2015-08-10 DIAGNOSIS — R0600 Dyspnea, unspecified: Secondary | ICD-10-CM

## 2015-08-10 NOTE — Progress Notes (Signed)
OFFICE NOTE  Chief Complaint:  Chest pain and shortness of breath  Primary Care Physician: Gwen Pounds, MD  HPI:  Natasha [name] is an anxious 55 year old female patient of Dr. Timothy Lasso. She is a chronic smoker and has COPD/chronic bronchitis. Over the past month or so she's had recurrent upper respiratory infections and is been on 2 sets of antibiotics. She again is currently having symptoms of congestion, cough with productive sputum, wheezing and shortness of breath. Prior to these exacerbations however in the end of December she had some worsening shortness of breath and left-sided chest pain. She was seen in the ER and ruled out by troponins. Her chest pain is slightly worse with exertion or laying on one side. It does not radiate. She's concerned about her significant family history of coronary disease, both parents are deceased and her father had cancer and her mother had a stroke, but neither had any significant coronary artery disease there was known. She did have an EKG which showed poor R-wave progression anteriorly but no acute ischemic changes. 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 She is also working on smoking cessation.  PMHx:  Past Medical History  Diagnosis Date  . Hemorrhoids   . Arthritis   . Hypertension   . DDD (degenerative disc disease)   . Depression   . COPD (chronic obstructive pulmonary disease) (HCC)   . Vitamin D deficiency   . DDD (degenerative disc disease), lumbosacral     Past Surgical History  Procedure Laterality Date  . Vaginal hysterectomy  06/2000    partial, Dr. Jeanine Luz  . Cervical disc surgery  12/2010    Dr. Franky Macho  . Tonsillectomy      as teenager, in [place]  . Mouth surgery    . Trigger finger release Left 04/16/2013    Procedure: RELEASE TRIGGER FINGER/A-1 PULLEY LEFT THUMB;  Surgeon: Wyn Forster., MD;  Location: [facility];  Service: Orthopedics;  Laterality: Left;    FAMHx:  Family History  Problem Relation Age of Onset  . Diabetes    . Colitis    . COPD Mother   . Stroke Mother   . Hypertension Mother   . Hyperlipidemia Mother   . Hypertension Father   . Diabetes Father   . Cancer Father   . Hyperlipidemia Father     SOCHx:   reports that she has been smoking Cigarettes.  She has a 35 pack-year smoking history. She has never used smokeless tobacco. She reports that she drinks alcohol. She reports that she does not use illicit drugs.  ALLERGIES:  No Known Allergies  ROS: Pertinent items noted in HPI and remainder of comprehensive ROS otherwise negative.  HOME MEDS: Current Outpatient Prescriptions  Medication Sig Dispense Refill  . aspirin 81 MG tablet Take 81 mg by mouth at bedtime.     Marland Kitchen atorvastatin (LIPITOR) 10 MG tablet Take 10 mg by mouth at bedtime.  1  . bisoprolol-hydrochlorothiazide (ZIAC) 5-6.25 MG per tablet Take 1 tablet by mouth daily.    . Boswellia-Glucosamine-Vit D (GLUCOSAMINE COMPLEX PO) Take 1 tablet by mouth daily.    Marland Kitchen buPROPion (WELLBUTRIN SR) 150 MG 12 hr tablet Take 150 mg by mouth 2 (two) times daily.  1  . dextromethorphan (DELSYM) 30 MG/5ML liquid Take 5 mLs (30 mg total) by mouth 2 (two) times  daily. 89 mL 0  . escitalopram (LEXAPRO) 5 MG tablet Take 10 mg by mouth at bedtime.     . gabapentin (NEURONTIN) 100 MG capsule Take 100 mg by mouth 3 (three) times daily as needed.    . hydrochlorothiazide (HYDRODIURIL) 12.5 MG tablet Take 12.5 mg by mouth daily.  10  . losartan (COZAAR) 50 MG tablet Take 50 mg by mouth daily.    . montelukast (SINGULAIR) 10 MG tablet Take 10 mg by mouth at bedtime.    . MULTIPLE VITAMIN PO Take 1 tablet by mouth daily.     . nitroGLYCERIN (NITROSTAT) 0.3 MG SL tablet Place 1 tablet (0.3 mg total) under the tongue every 5 (five) minutes as needed for chest pain. 90 tablet 12  . omeprazole (PRILOSEC) 40 MG capsule Take 1  capsule (40 mg total) by mouth 2 (two) times daily. 60 capsule 0  . ranitidine (ZANTAC) 150 MG tablet Take 150 mg by mouth daily as needed for heartburn.    . traMADol (ULTRAM) 50 MG tablet Take 1 tablet (50 mg total) by mouth 4 (four) times daily. 30 tablet 0   No current facility-administered medications for this visit.    LABS/IMAGING: No results found for this or any previous visit (from the past 48 hour(s)). No results found.  WEIGHTS: Wt Readings from Last 3 Encounters:  08/10/15 176 lb (79.833 kg)  07/02/15 171 lb 9.6 oz (77.837 kg)  04/16/13 168 lb (76.204 kg)    VITALS: BP 118/72 mmHg  Pulse 70  Ht  (1.753 m)  Wt 176 lb (79.833 kg)  BMI 25.98 kg/m2  EXAM: General appearance: alert, appears older than stated age and mild distress Neck: no carotid bruit, no JVD and thyroid not enlarged, symmetric, no tenderness/mass/nodules Lungs: diminished breath sounds bilaterally and rhonchi bilaterally Heart: regular rate and rhythm, S1, S2 normal, no murmur, click, rub or gallop Abdomen: soft, non-tender; bowel sounds normal; no masses,  no organomegaly Extremities: extremities normal, atraumatic, no cyanosis or edema Pulses: 2+ and symmetric Skin: Skin color, texture, turgor normal. No rashes or lesions Neurologic: Grossly normal Psych: Pleasant  EKG: I personally reviewed her hospital EKG which shows normal sinus rhythm and poor array progression anteriorly  ASSESSMENT: 1. Chest pain 2. Dyspnea and exertion 3. Chronic bronchitis with recurrent exacerbation 4. Ongoing tobacco use 5. Hypertension 6. Dyslipidemia 7. Strong family history of premature coronary artery disease  PLAN: 1.   [name] had some progressive shortness of breath and chest pain. She has some mild EKG changes and I recommend both an echocardiogram and stress test to evaluate for coronary ischemia. At this point she has significant upper respiratory symptoms and will likely not be able to do  stress testing for several weeks until this improves. I start with an echocardiogram and if there are any abnormal findings certainly consider stress testing. Plan to see her back to discuss those results in a few weeks.  Thanks for the kind referral  Chrystie Nose, MD, [facility] Attending Cardiologist [facility]  Chrystie Nose 08/10/2015, 5:04 PM

## 2015-08-10 NOTE — Patient Instructions (Signed)
Your physician has requested that you have an echocardiogram. Echocardiography is a painless test that uses sound waves to create images of your heart. It provides your doctor with information about the size and shape of your heart and how well your heart's chambers and valves are working. This procedure takes approximately one hour. There are no restrictions for this procedure.   Your physician has requested that you have a lexiscan myoview. For further information please visit [email or web address] Please follow instruction sheet, as given. This will be done in about 3 weeks.  Your physician recommends that you schedule a follow-up appointment in: 1 month with Dr Rennis Golden

## 2015-08-11 ENCOUNTER — Encounter: Payer: Self-pay | Admitting: Internal Medicine

## 2015-08-24 ENCOUNTER — Ambulatory Visit (HOSPITAL_COMMUNITY): Payer: BLUE CROSS/BLUE SHIELD | Attending: Cardiology

## 2015-08-24 ENCOUNTER — Other Ambulatory Visit: Payer: Self-pay

## 2015-08-24 DIAGNOSIS — I10 Essential (primary) hypertension: Secondary | ICD-10-CM | POA: Insufficient documentation

## 2015-08-24 DIAGNOSIS — R0602 Shortness of breath: Secondary | ICD-10-CM

## 2015-08-24 DIAGNOSIS — R079 Chest pain, unspecified: Secondary | ICD-10-CM | POA: Diagnosis not present

## 2015-08-25 ENCOUNTER — Telehealth (HOSPITAL_COMMUNITY): Payer: Self-pay

## 2015-08-25 NOTE — Telephone Encounter (Signed)
Encounter complete. 

## 2015-08-30 ENCOUNTER — Inpatient Hospital Stay (HOSPITAL_COMMUNITY): Admission: RE | Admit: 2015-08-30 | Payer: BLUE CROSS/BLUE SHIELD | Source: Ambulatory Visit

## 2015-09-14 ENCOUNTER — Ambulatory Visit: Payer: BLUE CROSS/BLUE SHIELD | Admitting: Internal Medicine

## 2015-09-22 ENCOUNTER — Telehealth (HOSPITAL_COMMUNITY): Payer: Self-pay

## 2015-09-22 NOTE — Telephone Encounter (Signed)
Encounter complete. 

## 2015-09-27 ENCOUNTER — Ambulatory Visit (HOSPITAL_COMMUNITY)
Admission: RE | Admit: 2015-09-27 | Discharge: 2015-09-27 | Disposition: A | Discharge status: Home / Self Care | Payer: BLUE CROSS/BLUE SHIELD | Source: Ambulatory Visit | Attending: Internal Medicine | Admitting: Internal Medicine

## 2015-09-27 DIAGNOSIS — E663 Overweight: Secondary | ICD-10-CM | POA: Insufficient documentation

## 2015-09-27 DIAGNOSIS — Z6826 Body mass index (BMI) 26.0-26.9, adult: Secondary | ICD-10-CM | POA: Insufficient documentation

## 2015-09-27 DIAGNOSIS — Z8249 Family history of ischemic heart disease and other diseases of the circulatory system: Secondary | ICD-10-CM | POA: Insufficient documentation

## 2015-09-27 DIAGNOSIS — R079 Chest pain, unspecified: Secondary | ICD-10-CM

## 2015-09-27 DIAGNOSIS — R0609 Other forms of dyspnea: Secondary | ICD-10-CM | POA: Diagnosis not present

## 2015-09-27 DIAGNOSIS — R0602 Shortness of breath: Secondary | ICD-10-CM | POA: Diagnosis not present

## 2015-09-27 DIAGNOSIS — Z720 Tobacco use: Secondary | ICD-10-CM | POA: Diagnosis not present

## 2015-09-27 DIAGNOSIS — I10 Essential (primary) hypertension: Secondary | ICD-10-CM | POA: Insufficient documentation

## 2015-09-27 LAB — MYOCARDIAL PERFUSION IMAGING
LV dias vol: 66 mL (ref 46–106)
LV sys vol: 24 mL
Peak HR: 99 {beats}/min
Rest HR: 73 {beats}/min
SDS: 2
SRS: 2
SSS: 4
TID: 1.21

## 2015-09-27 MED ORDER — AMINOPHYLLINE 25 MG/ML IV SOLN
75.0000 mg | Freq: Once | INTRAVENOUS | Status: AC
  Administered 2015-09-27: 75 mg via INTRAVENOUS

## 2015-09-27 MED ORDER — TECHNETIUM TC 99M SESTAMIBI GENERIC - CARDIOLITE
32.0000 | Freq: Once | INTRAVENOUS | Status: AC | PRN
  Administered 2015-09-27: 32 via INTRAVENOUS

## 2015-09-27 MED ORDER — TECHNETIUM TC 99M SESTAMIBI GENERIC - CARDIOLITE
10.7000 | Freq: Once | INTRAVENOUS | Status: AC | PRN
  Administered 2015-09-27: 10.7 via INTRAVENOUS

## 2015-09-27 MED ORDER — REGADENOSON 0.4 MG/5ML IV SOLN
0.4000 mg | Freq: Once | INTRAVENOUS | Status: AC
  Administered 2015-09-27: 0.4 mg via INTRAVENOUS

## 2015-09-30 ENCOUNTER — Ambulatory Visit (INDEPENDENT_AMBULATORY_CARE_PROVIDER_SITE_OTHER): Payer: BLUE CROSS/BLUE SHIELD | Admitting: Internal Medicine

## 2015-09-30 ENCOUNTER — Encounter: Payer: Self-pay | Admitting: Internal Medicine

## 2015-09-30 VITALS — BP 129/84 | HR 80 | Ht 70.0 in | Wt 177.1 lb

## 2015-09-30 DIAGNOSIS — R0600 Dyspnea, unspecified: Secondary | ICD-10-CM

## 2015-09-30 DIAGNOSIS — E785 Hyperlipidemia, unspecified: Secondary | ICD-10-CM | POA: Diagnosis not present

## 2015-09-30 DIAGNOSIS — I10 Essential (primary) hypertension: Secondary | ICD-10-CM

## 2015-09-30 DIAGNOSIS — Z720 Tobacco use: Secondary | ICD-10-CM

## 2015-09-30 NOTE — Patient Instructions (Signed)
Take Chantix as directed.   Your physician recommends that you schedule a follow-up appointment as needed with Dr. Rennis GoldenHilty

## 2015-10-02 NOTE — Progress Notes (Signed)
OFFICE NOTE  Chief Complaint:  Follow-up studies  Primary Care Physician: Gwen Pounds, MD  HPI:  Natasha [name] is an anxious 55 year old female patient of Dr. Timothy Lasso. She is a chronic smoker and has COPD/chronic bronchitis. Over the past month or so she's had recurrent upper respiratory infections and is been on 2 sets of antibiotics. She again is currently having symptoms of congestion, cough with productive sputum, wheezing and shortness of breath. Prior to these exacerbations however in the end of December she had some worsening shortness of breath and left-sided chest pain. She was seen in the ER and ruled out by troponins. Her chest pain is slightly worse with exertion or laying on one side. It does not radiate. She's concerned about her significant family history of coronary disease, both parents are deceased and her father had cancer and her mother had a stroke, but neither had any significant coronary artery disease there was known. She did have an EKG which showed poor R-wave progression anteriorly but no acute ischemic changes. 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 She is also working on smoking cessation.  Natasha [name] returns today for follow-up of her studies. She reports her chest pain is improved however she still short of breath. She is now more than ever interested in stopping smoking. She is requesting Chantix today to help her with that. I reviewed her echocardiogram which demonstrated an EF of 55-60% with diastolic dysfunction but normal wall motion. She also underwent a Myoview stress test which demonstrated no reversible ischemia with an EF of 64%. Both of these tests are very reassuring and I think that she is his playing a significant role in her symptoms.  PMHx:  Past Medical History  Diagnosis Date  . Hemorrhoids   . Arthritis   . Hypertension   . DDD (degenerative  disc disease)   . Depression   . COPD (chronic obstructive pulmonary disease) (HCC)   . Vitamin D deficiency   . DDD (degenerative disc disease), lumbosacral     Past Surgical History  Procedure Laterality Date  . Vaginal hysterectomy  06/2000    partial, Dr. Jeanine Luz  . Cervical disc surgery  12/2010    Dr. Franky Macho  . Tonsillectomy      as teenager, in [place]  . Mouth surgery    . Trigger finger release Left 04/16/2013    Procedure: RELEASE TRIGGER FINGER/A-1 PULLEY LEFT THUMB;  Surgeon: Wyn Forster., MD;  Location: [facility];  Service: Orthopedics;  Laterality: Left;    FAMHx:  Family History  Problem Relation Age of Onset  . Diabetes    . Colitis    . COPD Mother   . Stroke Mother   . Hypertension Mother   . Hyperlipidemia Mother   . Hypertension Father   . Diabetes Father   . Cancer Father   . Hyperlipidemia Father   . Hyperlipidemia Brother   . Cancer Maternal Grandmother   . Stroke Paternal Grandmother     SOCHx:   reports that she has been smoking Cigarettes.  She has a 35 pack-year smoking history. She has never used smokeless tobacco. She reports that she drinks alcohol. She reports that she does not use illicit drugs.  ALLERGIES:  No Known Allergies  ROS: Pertinent items noted in HPI and remainder of comprehensive ROS otherwise negative.  HOME  MEDS: Current Outpatient Prescriptions  Medication Sig Dispense Refill  . albuterol (PROVENTIL HFA;VENTOLIN HFA) 108 (90 Base) MCG/ACT inhaler Inhale 2 puffs into the lungs every 4 (four) hours as needed for wheezing or shortness of breath.    Marland Kitchen. aspirin 81 MG tablet Take 81 mg by mouth at bedtime.     Marland Kitchen. atorvastatin (LIPITOR) 10 MG tablet Take 10 mg by mouth at bedtime.  1  . bisoprolol-hydrochlorothiazide (ZIAC) 10-6.25 MG tablet Take 1 tablet by mouth daily.    . Boswellia-Glucosamine-Vit D (GLUCOSAMINE COMPLEX PO) Take 1 tablet by mouth daily.    Marland Kitchen. BREO ELLIPTA 100-25 MCG/INH AEPB  Inhale 1 puff into the lungs daily.  5  . buPROPion (WELLBUTRIN SR) 150 MG 12 hr tablet Take 150 mg by mouth 2 (two) times daily.  1  . escitalopram (LEXAPRO) 5 MG tablet Take 10 mg by mouth at bedtime.     . gabapentin (NEURONTIN) 100 MG capsule Take 100 mg by mouth 3 (three) times daily as needed.    . hydrochlorothiazide (HYDRODIURIL) 12.5 MG tablet Take 12.5 mg by mouth daily.  10  . losartan (COZAAR) 50 MG tablet Take 50 mg by mouth daily.    . MULTIPLE VITAMIN PO Take 1 tablet by mouth daily.     . ranitidine (ZANTAC) 150 MG tablet Take 150 mg by mouth daily as needed for heartburn.    . Varenicline Tartrate (CHANTIX PO) Take by mouth as directed.     No current facility-administered medications for this visit.    LABS/IMAGING: No results found for this or any previous visit (from the past 48 hour(s)). No results found.  WEIGHTS: Wt Readings from Last 3 Encounters:  09/30/15 177 lb 2 oz (80.343 kg)  09/27/15 176 lb (79.833 kg)  08/10/15 176 lb (79.833 kg)    VITALS: BP 129/84 mmHg  Pulse 80  Ht 5\' 10"  (1.778 m)  Wt 177 lb 2 oz (80.343 kg)  BMI 25.41 kg/m2  EXAM: Deferred  EKG: Deferred  ASSESSMENT: 1. Chest pain 2. Dyspnea and exertion 3. Chronic bronchitis with recurrent exacerbation 4. Ongoing tobacco use 5. Hypertension 6. Dyslipidemia 7. Strong family history of premature coronary artery disease  PLAN: 1.   Natasha [name] had a low risk Myoview with normal LV function and also a low risk echocardiogram with normal LV function and mild diastolic dysfunction probably secondary to hypertension. I agree that smoking cessation would be very beneficial for her. I've gone ahead and prescribed her for Chantix today and described how to go about using it. She understands she cannot use nicotine replacement with it. She should follow-up with her primary care provider am happy to see her on an as-needed basis.  Natasha NoseKenneth C. Hilty, MD, [facility] CenterFACC Attending Cardiologist [facility]  Natasha AbuKenneth C [facility] [name] 10/02/2015, 4:06 PM

## 2016-01-13 DIAGNOSIS — F331 Major depressive disorder, recurrent, moderate: Secondary | ICD-10-CM | POA: Diagnosis not present

## 2016-01-25 DIAGNOSIS — F331 Major depressive disorder, recurrent, moderate: Secondary | ICD-10-CM | POA: Diagnosis not present

## 2016-01-29 ENCOUNTER — Encounter (HOSPITAL_COMMUNITY): Payer: Self-pay | Admitting: Emergency Medicine

## 2016-01-29 ENCOUNTER — Observation Stay (HOSPITAL_COMMUNITY)
Admission: EM | Admit: 2016-01-29 | Discharge: 2016-01-30 | Disposition: A | Discharge status: Home / Self Care | Payer: BLUE CROSS/BLUE SHIELD | Attending: Internal Medicine | Admitting: Internal Medicine

## 2016-01-29 ENCOUNTER — Emergency Department (HOSPITAL_COMMUNITY): Payer: BLUE CROSS/BLUE SHIELD

## 2016-01-29 ENCOUNTER — Observation Stay (HOSPITAL_COMMUNITY): Payer: BLUE CROSS/BLUE SHIELD

## 2016-01-29 DIAGNOSIS — J449 Chronic obstructive pulmonary disease, unspecified: Secondary | ICD-10-CM | POA: Insufficient documentation

## 2016-01-29 DIAGNOSIS — F17210 Nicotine dependence, cigarettes, uncomplicated: Secondary | ICD-10-CM | POA: Insufficient documentation

## 2016-01-29 DIAGNOSIS — G459 Transient cerebral ischemic attack, unspecified: Secondary | ICD-10-CM | POA: Diagnosis not present

## 2016-01-29 DIAGNOSIS — R270 Ataxia, unspecified: Secondary | ICD-10-CM | POA: Diagnosis not present

## 2016-01-29 DIAGNOSIS — Z7982 Long term (current) use of aspirin: Secondary | ICD-10-CM | POA: Diagnosis not present

## 2016-01-29 DIAGNOSIS — R42 Dizziness and giddiness: Secondary | ICD-10-CM

## 2016-01-29 DIAGNOSIS — I10 Essential (primary) hypertension: Secondary | ICD-10-CM | POA: Diagnosis not present

## 2016-01-29 DIAGNOSIS — Z79899 Other long term (current) drug therapy: Secondary | ICD-10-CM | POA: Insufficient documentation

## 2016-01-29 DIAGNOSIS — E785 Hyperlipidemia, unspecified: Secondary | ICD-10-CM | POA: Diagnosis present

## 2016-01-29 HISTORY — DX: Hyperlipidemia, unspecified: E78.5

## 2016-01-29 LAB — CBC
HCT: 44.5 % (ref 36.0–46.0)
Hemoglobin: 14.7 g/dL (ref 12.0–15.0)
MCH: 30.8 pg (ref 26.0–34.0)
MCHC: 33 g/dL (ref 30.0–36.0)
MCV: 93.3 fL (ref 78.0–100.0)
Platelets: 299 10*3/uL (ref 150–400)
RBC: 4.77 MIL/uL (ref 3.87–5.11)
RDW: 12.8 % (ref 11.5–15.5)
WBC: 7.2 10*3/uL (ref 4.0–10.5)

## 2016-01-29 LAB — BASIC METABOLIC PANEL
Anion gap: 5 (ref 5–15)
BUN: 13 mg/dL (ref 6–20)
CO2: 27 mmol/L (ref 22–32)
Calcium: 9.2 mg/dL (ref 8.9–10.3)
Chloride: 104 mmol/L (ref 101–111)
Creatinine, Ser: 0.99 mg/dL (ref 0.44–1.00)
GFR calc Af Amer: >60 mL/min (ref >60)
GFR calc non Af Amer: >60 mL/min (ref >60)
Glucose, Bld: 119 mg/dL — ABNORMAL HIGH (ref 65–99)
Potassium: 3.9 mmol/L (ref 3.5–5.1)
Sodium: 136 mmol/L (ref 135–145)

## 2016-01-29 LAB — TROPONIN I: Troponin I: <0.03 ng/mL (ref <0.03)

## 2016-01-29 MED ORDER — BUPROPION HCL ER (SR) 150 MG PO TB12
150.0000 mg | ORAL_TABLET | Freq: Two times a day (BID) | ORAL | Status: DC
  Administered 2016-01-30 (×2): 150 mg via ORAL
  Filled 2016-01-29 (×6): qty 1

## 2016-01-29 MED ORDER — SODIUM CHLORIDE 0.9 % IV SOLN
INTRAVENOUS | Status: DC
  Administered 2016-01-29: 23:00:00 via INTRAVENOUS

## 2016-01-29 MED ORDER — ASPIRIN EC 81 MG PO TBEC
81.0000 mg | DELAYED_RELEASE_TABLET | Freq: Every day | ORAL | Status: DC
Start: 2016-01-29 — End: 2016-01-30
  Administered 2016-01-29: 81 mg via ORAL
  Filled 2016-01-29: qty 1

## 2016-01-29 MED ORDER — STROKE: EARLY STAGES OF RECOVERY BOOK
Freq: Once | Status: DC
  Filled 2016-01-29: qty 1

## 2016-01-29 MED ORDER — ENOXAPARIN SODIUM 40 MG/0.4ML ~~LOC~~ SOLN
40.0000 mg | SUBCUTANEOUS | Status: DC
  Administered 2016-01-30: 40 mg via SUBCUTANEOUS
  Filled 2016-01-29: qty 0.4

## 2016-01-29 MED ORDER — FLUTICASONE FUROATE-VILANTEROL 100-25 MCG/INH IN AEPB
1.0000 | INHALATION_SPRAY | Freq: Every day | RESPIRATORY_TRACT | Status: DC
  Filled 2016-01-29: qty 28

## 2016-01-29 MED ORDER — SENNOSIDES-DOCUSATE SODIUM 8.6-50 MG PO TABS: 1.0000 | ORAL_TABLET | Freq: Every evening | ORAL | Status: DC | PRN

## 2016-01-29 MED ORDER — GABAPENTIN 100 MG PO CAPS
100.0000 mg | ORAL_CAPSULE | Freq: Three times a day (TID) | ORAL | Status: DC
Start: 2016-01-29 — End: 2016-01-30
  Administered 2016-01-30 (×2): 100 mg via ORAL
  Filled 2016-01-29 (×3): qty 1

## 2016-01-29 MED ORDER — ATORVASTATIN CALCIUM 10 MG PO TABS
10.0000 mg | ORAL_TABLET | Freq: Every day | ORAL | Status: DC
  Administered 2016-01-29: 10 mg via ORAL
  Filled 2016-01-29: qty 1

## 2016-01-29 MED ORDER — ALBUTEROL SULFATE (2.5 MG/3ML) 0.083% IN NEBU: 3.0000 mL | INHALATION_SOLUTION | RESPIRATORY_TRACT | Status: DC | PRN

## 2016-01-29 MED ORDER — BUPROPION HCL ER (SR) 150 MG PO TB12
ORAL_TABLET | ORAL | Status: AC
  Filled 2016-01-29: qty 1

## 2016-01-29 NOTE — Discharge Instructions (Signed)
Take your usual prescriptions as previously directed.  Call your regular medical doctor tomorrow to schedule a follow up appointment within the next 24 hours.  Return to the Emergency Department immediately sooner if worsening.

## 2016-01-29 NOTE — ED Notes (Signed)
Patient states she used restroom before CT. No sample collected will try again after a bit.

## 2016-01-29 NOTE — ED Provider Notes (Signed)
[facility]-EM[facility]Y DEPT Provider Note   CSN: [id number] Arrival date & time: 01/29/16  1401  First Provider Contact:  None       History   Chief Complaint Chief Complaint  Patient presents with  . Hypertension    HPI Natasha [name] is a 55 y.o. female.  HPI  Pt was seen at 1725. Per pt, c/o gradual onset and persistence of constant ataxia that began this morning approximately 0200. Pt states she went to be approximately 2300/2330 "fine," and woke up approximately 0200 to go to the bathroom. Pt states she was "off balance" while she was walking. Pt states she went back to bed, woke up at 0700 and "felt the same way," so she went back to bed again. Pt states her symptoms continued, so she came to the ED for further evaluation. Has been associated with feeling lightheaded and "numbness" of her LUE.  Denies abd pain, no N/V/D, no CP/SOB, no back pain, no neck pain, no headache, no visual changes, no facial droop, no dysphagia, no focal motor weakness, no syncope, no vertigo.    Past Medical History:  Diagnosis Date  . Arthritis   . COPD (chronic obstructive pulmonary disease) (HCC)   . DDD (degenerative disc disease)   . DDD (degenerative disc disease), lumbosacral   . Depression   . Hemorrhoids   . Hypertension   . Vitamin D deficiency     Patient Active Problem List   Diagnosis Date Noted  . Chest pain 08/10/2015  . Dyspnea 08/10/2015  . Chronic bronchitis (HCC) 08/10/2015  . Tobacco abuse 08/10/2015  . Essential hypertension 08/10/2015  . Dyslipidemia 08/10/2015  . Chest pain with low risk for cardiac etiology 07/01/2015  . ABDOMINAL BLOATING 12/13/2009  . DIARRHEA 12/13/2009  . CHANGE IN BOWELS 12/13/2009  . BURSITIS, SHOULDER 06/19/2007  . HTN (hypertension) 06/19/2007    Past Surgical History:  Procedure Laterality Date  . CERVICAL DISC SURGERY  12/2010   Dr. Franky Macho  . MOUTH SURGERY    . TONSILLECTOMY     as teenager, in [place]  . TRIGGER FINGER RELEASE Left  04/16/2013   Procedure: RELEASE TRIGGER FINGER/A-1 PULLEY LEFT THUMB;  Surgeon: Wyn Forster., MD;  Location: [facility];  Service: Orthopedics;  Laterality: Left;  Marland Kitchen VAGINAL HYSTERECTOMY  06/2000   partial, Dr. Jeanine Luz       Home Medications    Prior to Admission medications   Medication Sig Start Date End Date Taking? Authorizing Provider  albuterol (PROVENTIL HFA;VENTOLIN HFA) 108 (90 Base) MCG/ACT inhaler Inhale 2 puffs into the lungs every 4 (four) hours as needed for wheezing or shortness of breath.    Historical Provider, MD  aspirin 81 MG tablet Take 81 mg by mouth at bedtime.     Historical Provider, MD  atorvastatin (LIPITOR) 10 MG tablet Take 10 mg by mouth at bedtime. 06/24/15   Historical Provider, MD  bisoprolol-hydrochlorothiazide (ZIAC) 10-6.25 MG tablet Take 1 tablet by mouth daily.    Historical Provider, MD  Boswellia-Glucosamine-Vit D (GLUCOSAMINE COMPLEX PO) Take 1 tablet by mouth daily.    Historical Provider, MD  BREO ELLIPTA 100-25 MCG/INH AEPB Inhale 1 puff into the lungs daily. 08/10/15   Historical Provider, MD  buPROPion (WELLBUTRIN SR) 150 MG 12 hr tablet Take 150 mg by mouth 2 (two) times daily. 05/31/15   Historical Provider, MD  escitalopram (LEXAPRO) 5 MG tablet Take 10 mg by mouth at bedtime.     Historical  Provider, MD  gabapentin (NEURONTIN) 100 MG capsule Take 100 mg by mouth 3 (three) times daily as needed.    Historical Provider, MD  hydrochlorothiazide (HYDRODIURIL) 12.5 MG tablet Take 12.5 mg by mouth daily. 06/29/15   Historical Provider, MD  losartan (COZAAR) 50 MG tablet Take 50 mg by mouth daily.    Historical Provider, MD  MULTIPLE VITAMIN PO Take 1 tablet by mouth daily.     Historical Provider, MD  ranitidine (ZANTAC) 150 MG tablet Take 150 mg by mouth daily as needed for heartburn.    Historical Provider, MD  Varenicline Tartrate (CHANTIX PO) Take by mouth as directed.    Historical Provider, MD    Family  History Family History  Problem Relation Age of Onset  . COPD Mother   . Stroke Mother   . Hypertension Mother   . Hyperlipidemia Mother   . Hypertension Father   . Diabetes Father   . Cancer Father   . Hyperlipidemia Father   . Hyperlipidemia Brother   . Cancer Maternal Grandmother   . Stroke Paternal Grandmother   . Diabetes    . Colitis      Social History Social History  Substance Use Topics  . Smoking status: Current Some Day Smoker    Packs/day: 1.00    Years: 35.00    Types: Cigarettes  . Smokeless tobacco: Never Used  . Alcohol use Yes     Comment: several times weekly     Allergies   Review of patient's allergies indicates no known allergies.   Review of Systems Review of Systems ROS: Statement: All systems negative except as marked or noted in the HPI; Constitutional: Negative for fever and chills. ; ; Eyes: Negative for eye pain, redness and discharge. ; ; ENMT: Negative for ear pain, hoarseness, nasal congestion, sinus pressure and sore throat. ; ; Cardiovascular: Negative for chest pain, palpitations, diaphoresis, dyspnea and peripheral edema. ; ; Respiratory: Negative for cough, wheezing and stridor. ; ; Gastrointestinal: Negative for nausea, vomiting, diarrhea, abdominal pain, blood in stool, hematemesis, jaundice and rectal bleeding. . ; ; Genitourinary: Negative for dysuria, flank pain and hematuria. ; ; Musculoskeletal: Negative for back pain and neck pain. Negative for swelling and trauma.; ; Skin: Negative for pruritus, rash, abrasions, blisters, bruising and skin lesion.; ; Neuro: +ataxia, paresthesias LUE, lightheadedness. Negative for headache and neck stiffness. Negative for weakness, altered level of consciousness, altered mental status, extremity weakness, involuntary movement, seizure and syncope.      Physical Exam Updated Vital Signs BP 175/88 (BP Location: Left Arm)   Pulse (!) 59   Temp 98.6 F (37 C) (Oral)   Resp 17   Ht 5\' 11"  (1.803  m)   Wt 230 lb (104.3 kg)   SpO2 99%   BMI 32.08 kg/m   18:26 Orthostatic Vital Signs DF  Orthostatic Lying   BP- Lying:  154/93  Pulse- Lying: 55      Orthostatic Sitting  BP- Sitting:  179/91  Pulse- Sitting: 58      Orthostatic Standing at 0 minutes  BP- Standing at 0 minutes:  178/91  Pulse- Standing at 0 minutes: 64     Physical Exam 1730: Physical examination:  Nursing notes reviewed; Vital signs and O2 SAT reviewed;  Constitutional: Well developed, Well nourished, Well hydrated, In no acute distress; Head:  Normocephalic, atraumatic; Eyes: EOMI, PERRL, No scleral icterus; ENMT: Mouth and pharynx normal, Mucous membranes moist; Neck: Supple, Full range of motion, No lymphadenopathy; Cardiovascular:  Regular rate and rhythm, No gallop; Respiratory: Breath sounds clear & equal bilaterally, No wheezes.  Speaking full sentences with ease, Normal respiratory effort/excursion; Chest: Nontender, Movement normal; Abdomen: Soft, Nontender, Nondistended, Normal bowel sounds; Genitourinary: No CVA tenderness; Extremities: Pulses normal, No tenderness, No edema, No calf edema or asymmetry.; Neuro: AA&Ox3, Major CN grossly intact. Speech clear.  No facial droop.  No nystagmus. Grips equal. Strength 5/5 equal bilat UE's and LE's. DTR 2/4 equal bilat UE's and LE's.  No gross sensory deficits.  Normal cerebellar testing bilat UE's (finger-nose) and LE's (heel-shin)..; Skin: Color normal, Warm, Dry.   ED Treatments / Results  Labs (all labs ordered are listed, but only abnormal results are displayed)   EKG  EKG Interpretation  Date/Time:  Sunday January 29 2016 17:05:48 EDT Ventricular Rate:  57 PR Interval:    QRS Duration: 86 QT Interval:  419 QTC Calculation: 408 R Axis:   79 Text Interpretation:  Sinus rhythm When compared with ECG of 07/01/2015 No significant change was found Confirmed by [facility]  MD, Nicholos Johns [phone]) on 01/29/2016 5:38:59 PM        Radiology   Procedures Procedures (including critical care time)  Medications Ordered in ED Medications - No data to display   Initial Impression / Assessment and Plan / ED Course  I have reviewed the triage vital signs and the nursing notes.  Pertinent labs & imaging results that were available during my care of the patient were reviewed by me and considered in my medical decision making (see chart for details).  MDM Reviewed: previous chart, nursing note and vitals Reviewed previous: labs and ECG Interpretation: labs, ECG and CT scan    Results for orders placed or performed during the hospital encounter of 01/29/16  Basic metabolic panel  Result Value Ref Range   Sodium 136 135 - 145 mmol/L   Potassium 3.9 3.5 - 5.1 mmol/L   Chloride 104 101 - 111 mmol/L   CO2 27 22 - 32 mmol/L   Glucose, Bld 119 (H) 65 - 99 mg/dL   BUN 13 6 - 20 mg/dL   Creatinine, Ser 6.04 0.44 - 1.00 mg/dL   Calcium 9.2 8.9 - 54.0 mg/dL   GFR calc non Af Amer >60 >60 mL/min   GFR calc Af Amer >60 >60 mL/min   Anion gap 5 5 - 15  CBC  Result Value Ref Range   WBC 7.2 4.0 - 10.5 K/uL   RBC 4.77 3.87 - 5.11 MIL/uL   Hemoglobin 14.7 12.0 - 15.0 g/dL   HCT 98.1 19.1 - 47.8 %   MCV 93.3 78.0 - 100.0 fL   MCH 30.8 26.0 - 34.0 pg   MCHC 33.0 30.0 - 36.0 g/dL   RDW 29.5 62.1 - 30.8 %   Platelets 299 150 - 400 K/uL  Troponin I  Result Value Ref Range   Troponin I <0.03 <0.03 ng/mL   Ct Head Wo Contrast Result Date: 01/29/2016 CLINICAL DATA:  Dizziness since this morning. Elevated blood pressure. EXAM: CT HEAD WITHOUT CONTRAST TECHNIQUE: Contiguous axial images were obtained from the base of the skull through the vertex without intravenous contrast. COMPARISON:  None. FINDINGS: The ventricles are normal in size and configuration. No extra-axial fluid collections are identified. The gray-white differentiation is normal. No CT findings for acute intracranial process such as hemorrhage or infarction.  No mass lesions. The brainstem and cerebellum are grossly normal. The bony structures are intact. Mild hyperostosis frontalis interna The paranasal sinuses and  mastoid air cells are clear except for a a small amount of fluid in the left half of the sphenoid sinus. The globes are intact. IMPRESSION: No acute intracranial findings or mass lesions. Electronically Signed   By: Rudie Meyer M.D.   On: 01/29/2016 18:33   1930:  Pt not orthostatic on VS. Denies vertigo symptoms/sense of movement. Concern for TIA/CVA as cause for symptoms, as pt has multiple risk factors. Pt informed re: dx testing results, including concern for TIA/CVA, and that I recommend admission for further evaluation.  Pt refuses admission.  I encouraged pt to stay, continues to refuse.  Pt makes her own medical decisions.  Risks of AMA explained to pt, including, but not limited to:  stroke, heart attack, cardiac arrythmia ("irregular heart rate/beat"), "passing out," temporary and/or permanent disability, death.  Pt verb understanding and continues to refuse admission, understanding the consequences of her decision.  I encouraged pt to follow up with her PMD tomorrow and return to the ED immediately if symptoms return, or for any other concerns.  Pt verb understanding, agreeable.  2030:  Pt has now changed her mind about admission and "wants to know what's going on." T/C to [facility] Dr. Sharl Ma, case discussed, including:  HPI, pertinent PM/SHx, VS/PE, dx testing, ED course and treatment:  Agreeable to admit, requests to write temporary orders, obtain observation tele bed to team APAdmits.     Final Clinical Impressions(s) / ED Diagnoses   Final diagnoses:  None    New Prescriptions New Prescriptions   No medications on file     Samuel Jester, DO 02/01/16 1257

## 2016-01-29 NOTE — ED Notes (Signed)
Patient would like something to eat and drink. MD notified.

## 2016-01-29 NOTE — H&P (Signed)
TRH H&P   Patient Demographics:    Natasha [name], is a 55 y.o. female  MRN: [id number]  DOB - 1960-11-12  Admit Date - 01/29/2016  Outpatient Primary MD for the patient is Gwen Pounds, MD  Referring MD/NP/PA: Dr Clarene Duke  Patient coming from: Home  Chief Complaint  Patient presents with  . Hypertension      HPI:    Natasha [name]  is a 55 y.o. female, With history of hypertension, COPD, depression, hemorrhoids [place] the ED after patient had episode of dizziness which began morning around 2 AM. Patient says that she noticed that she was off balance while walking to the bathroom though she went back to bed and woke up the morning feeding off-balance. Patient continued to feel that we hold a, she checked her blood pressure was elevated. At this time patient's symptoms have resolved. She denies any chest pain or shortness of breath. No nausea vomiting or diarrhea.  In the ED CT head is negative for stroke.    Review of systems:    In addition to the HPI above,  No Fever-chills, No Headache, No changes with Vision or hearing, No problems swallowing food or Liquids, No Chest pain, Cough or Shortness of Breath, No Abdominal pain, No Nausea or Vomiting, bowel movements are regular, No Blood in stool or Urine, No dysuria, No new skin rashes or bruises, No new joints pains-aches No recent weight gain or loss, No polyuria, polydypsia or polyphagia, No significant Mental Stressors.  A full 10 point Review of Systems was done, except as stated above, all other Review of Systems were negative.   With Past History of the following :    Past Medical History:  Diagnosis Date  . Arthritis   . COPD (chronic obstructive pulmonary disease) (HCC)   . DDD (degenerative disc disease)   . DDD (degenerative disc disease), lumbosacral   . Depression   . Hemorrhoids   . Hyperlipidemia   . Hypertension   .  Vitamin D deficiency       Past Surgical History:  Procedure Laterality Date  . CERVICAL DISC SURGERY  12/2010   Dr. Franky Macho  . MOUTH SURGERY    . TONSILLECTOMY     as teenager, in [place]  . TRIGGER FINGER RELEASE Left 04/16/2013   Procedure: RELEASE TRIGGER FINGER/A-1 PULLEY LEFT THUMB;  Surgeon: Wyn Forster., MD;  Location: [facility];  Service: Orthopedics;  Laterality: Left;  Marland Kitchen VAGINAL HYSTERECTOMY  06/2000   partial, Dr. Jeanine Luz      Social History:     Social History  Substance Use Topics  . Smoking status: Current Some Day Smoker    Packs/day: 1.00    Years: 35.00    Types: Cigarettes  . Smokeless tobacco: Never Used  . Alcohol use Yes     Comment: several times weekly        Family History :     Family History  Problem Relation Age of Onset  .  COPD Mother   . Stroke Mother   . Hypertension Mother   . Hyperlipidemia Mother   . Hypertension Father   . Diabetes Father   . Cancer Father   . Hyperlipidemia Father   . Hyperlipidemia Brother   . Cancer Maternal Grandmother   . Stroke Paternal Grandmother   . Diabetes    . Colitis        Home Medications:   Prior to Admission medications   Medication Sig Start Date End Date Taking? Authorizing Provider  albuterol (PROVENTIL HFA;VENTOLIN HFA) 108 (90 Base) MCG/ACT inhaler Inhale 2 puffs into the lungs every 4 (four) hours as needed for wheezing or shortness of breath.    Historical Provider, MD  aspirin 81 MG tablet Take 81 mg by mouth at bedtime.     Historical Provider, MD  atorvastatin (LIPITOR) 10 MG tablet Take 10 mg by mouth at bedtime. 06/24/15   Historical Provider, MD  bisoprolol-hydrochlorothiazide (ZIAC) 10-6.25 MG tablet Take 1 tablet by mouth daily.    Historical Provider, MD  Boswellia-Glucosamine-Vit D (GLUCOSAMINE COMPLEX PO) Take 1 tablet by mouth daily.    Historical Provider, MD  BREO ELLIPTA 100-25 MCG/INH AEPB Inhale 1 puff into the lungs daily. 08/10/15    Historical Provider, MD  buPROPion (WELLBUTRIN SR) 150 MG 12 hr tablet Take 150 mg by mouth 2 (two) times daily. 05/31/15   Historical Provider, MD  escitalopram (LEXAPRO) 5 MG tablet Take 10 mg by mouth at bedtime.     Historical Provider, MD  gabapentin (NEURONTIN) 100 MG capsule Take 100 mg by mouth 3 (three) times daily as needed.    Historical Provider, MD  hydrochlorothiazide (HYDRODIURIL) 12.5 MG tablet Take 12.5 mg by mouth daily. 06/29/15   Historical Provider, MD  losartan (COZAAR) 50 MG tablet Take 50 mg by mouth daily.    Historical Provider, MD  MULTIPLE VITAMIN PO Take 1 tablet by mouth daily.     Historical Provider, MD  ranitidine (ZANTAC) 150 MG tablet Take 150 mg by mouth daily as needed for heartburn.    Historical Provider, MD  Varenicline Tartrate (CHANTIX PO) Take by mouth as directed.    Historical Provider, MD     Allergies:    No Known Allergies   Physical Exam:   Vitals  Blood pressure 171/96, pulse 68, temperature 97.5 F (36.4 C), temperature source Oral, resp. rate 16, height  (1.778 m), weight 81.2 kg (179 lb 0.2 oz), SpO2 96 %.   1. General caucasian female* lying in bed in NAD, cooperative with exam  2. Normal affect and insight, Awake Alert, Oriented X 3.  3. No F.N deficits, ALL C.Nerves Intact, Strength 5/5 all 4 extremities, Sensation intact all 4 extremities, Plantars down going.  4. Ears and Eyes appear Normal, Conjunctivae clear, PERRLA. Moist Oral Mucosa.  5. Supple Neck, No JVD, No cervical lymphadenopathy appriciated, No Carotid Bruits.  6. Symmetrical Chest wall movement, Good air movement bilaterally, CTAB.  7. RRR, No Gallops, Rubs or Murmurs, No Parasternal Heave.No Leg edema  8. Positive Bowel Sounds, Abdomen Soft, No tenderness, No organomegaly appriciated,No rebound -guarding or rigidity.  9.  No Cyanosis, Normal Skin Turgor, No Skin Rash or Bruise.  10. Good muscle tone,  joints appear normal , no effusions, Normal  ROM.      Data Review:    CBC  Recent Labs Lab 01/29/16 1439  WBC 7.2  HGB 14.7  HCT 44.5  PLT 299  MCV 93.3  MCH 30.8  MCHC 33.0  RDW 12.8   ------------------------------------------------------------------------------------------------------------------  Chemistries   Recent Labs Lab 01/29/16 1439  NA 136  K 3.9  CL 104  CO2 27  GLUCOSE 119*  BUN 13  CREATININE 0.99  CALCIUM 9.2   ------------------------------------------------------------------------------------------------------------------  ------------------------------------------------------------------------------------------------------------------ Cardiac Enzymes:  Recent Labs Lab 01/29/16 1751  TROPONINI <0.03      ----------------------------------------------------------------------------------------------------------------   Imaging Results:    Ct Head Wo Contrast  Result Date: 01/29/2016 CLINICAL DATA:  Dizziness since this morning. Elevated blood pressure. EXAM: CT HEAD WITHOUT CONTRAST TECHNIQUE: Contiguous axial images were obtained from the base of the skull through the vertex without intravenous contrast. COMPARISON:  None. FINDINGS: The ventricles are normal in size and configuration. No extra-axial fluid collections are identified. The gray-white differentiation is normal. No CT findings for acute intracranial process such as hemorrhage or infarction. No mass lesions. The brainstem and cerebellum are grossly normal. The bony structures are intact. Mild hyperostosis frontalis interna The paranasal sinuses and mastoid air cells are clear except for a a small amount of fluid in the left half of the sphenoid sinus. The globes are intact. IMPRESSION: No acute intracranial findings or mass lesions. Electronically Signed   By: Rudie Meyer M.D.   On: 01/29/2016 18:33   My personal review of EKG: Rhythm NSR   Assessment & Plan:    Active Problems:   HTN (hypertension)   Ataxia   TIA  (transient ischemic attack)   1. TIA- patient presenting with transient ataxia, symptoms have now resolved. Will admit the patient [facility] workup including MRI/MRA brain, 2-D echo, carotid Doppler, lipid profile in a.m. 2. Uncontrolled hypertension- will hold antihypertensive medications at this time for permissive hypertension. 3. COPD- stable, continue DuoNeb when necessary, Breo Ellipta.   DVT Prophylaxis-   Lovenox   AM Labs Ordered, also please review Full Orders  Family Communication: No family at bedside  Code Status:  Full code  Admission status: Observation  Time spent in minutes : 60 min   Klein Willcox S M.D on 01/29/2016 at 10:14 PM  Between 7am to 7pm - Pager - [phone]. After 7pm go to [email or web address] - password [facility]  [facility] - Office  [phone]

## 2016-01-29 NOTE — ED Notes (Signed)
Pt has changed her mind and decided to stay in hospital for admission, Dr Clarene Duke notified, pt also requesting something to eat, per Dr Clarene Duke pt may eat, pt informed,

## 2016-01-29 NOTE — ED Notes (Signed)
Pt waiting patiently in waiting area in no distress.  Informed of delay.

## 2016-01-29 NOTE — ED Triage Notes (Signed)
Pt states she woke up not feeling well and has been checking her bp all day and has been elevated.  Pt very anxious at triage.

## 2016-01-30 ENCOUNTER — Observation Stay (HOSPITAL_COMMUNITY): Payer: BLUE CROSS/BLUE SHIELD

## 2016-01-30 ENCOUNTER — Encounter (HOSPITAL_COMMUNITY): Payer: Self-pay | Admitting: *Deleted

## 2016-01-30 ENCOUNTER — Observation Stay (HOSPITAL_BASED_OUTPATIENT_CLINIC_OR_DEPARTMENT_OTHER): Payer: BLUE CROSS/BLUE SHIELD

## 2016-01-30 DIAGNOSIS — I10 Essential (primary) hypertension: Secondary | ICD-10-CM

## 2016-01-30 DIAGNOSIS — I6523 Occlusion and stenosis of bilateral carotid arteries: Secondary | ICD-10-CM | POA: Diagnosis not present

## 2016-01-30 DIAGNOSIS — G459 Transient cerebral ischemic attack, unspecified: Secondary | ICD-10-CM

## 2016-01-30 DIAGNOSIS — E785 Hyperlipidemia, unspecified: Secondary | ICD-10-CM

## 2016-01-30 DIAGNOSIS — R270 Ataxia, unspecified: Secondary | ICD-10-CM | POA: Diagnosis not present

## 2016-01-30 DIAGNOSIS — R42 Dizziness and giddiness: Secondary | ICD-10-CM | POA: Diagnosis not present

## 2016-01-30 LAB — TROPONIN I
Troponin I: <0.03 ng/mL (ref <0.03)
Troponin I: <0.03 ng/mL (ref <0.03)
Troponin I: <0.03 ng/mL (ref <0.03)

## 2016-01-30 LAB — ECHOCARDIOGRAM COMPLETE
E decel time: 218 msec
E/e' ratio: 10.4
FS: 44 % (ref 28–44)
Height: 70 in
IVS/LV PW RATIO, ED: 0.91
LA ID, A-P, ES: 32 mm
LA diam end sys: 32 mm
LA diam index: 1.59 cm/m2
LA vol A4C: 43.1 ml
LA vol index: 17.8 mL/m2
LA vol: 35.9 mL
LV E/e' medial: 10.4
LV E/e'average: 10.4
LV PW d: 9.85 mm — AB (ref 0.6–1.1)
LV dias vol index: 36 mL/m2
LV dias vol: 73 mL (ref 46–106)
LV e' LATERAL: 9.14 cm/s
LV sys vol index: 12 mL/m2
LV sys vol: 24 mL (ref 14–42)
LVOT SV: 53 mL
LVOT VTI: 23.5 cm
LVOT area: 2.27 cm2
LVOT diameter: 17 mm
LVOT peak grad rest: 4 mmHg
LVOT peak vel: 105 cm/s
Lateral S' vel: 11.6 cm/s
MV Dec: 218
MV Peak grad: 4 mmHg
MV pk A vel: 90.7 m/s
MV pk E vel: 95.1 m/s
Simpson's disk: 67
Stroke v: 48 ml
TAPSE: 20.3 mm
TDI e' lateral: 9.14
TDI e' medial: 7.29
Weight: 2864.22 oz

## 2016-01-30 LAB — LIPID PANEL
Cholesterol: 156 mg/dL (ref 0–200)
HDL: 39 mg/dL — ABNORMAL LOW (ref >40)
LDL Cholesterol: 81 mg/dL (ref 0–99)
Total CHOL/HDL Ratio: 4 RATIO
Triglycerides: 180 mg/dL — ABNORMAL HIGH (ref <150)
VLDL: 36 mg/dL (ref 0–40)

## 2016-01-30 NOTE — Discharge Summary (Signed)
Physician Discharge Summary  Natasha [name] VHQ:[id number] DOB: Jun 07, 1961 DOA: 01/29/2016  PCP: Gwen Pounds, MD  Admit date: 01/29/2016 Discharge date: 01/30/2016  Admitted From: Home Disposition:  Home   Recommendations for Outpatient Follow-up:  1. Follow up with PCP in 1-2 weeks 2. Follow up with neurology in the next 2-3 weeks. They will call patient with appointment.   Home Health: No Equipment/Devices: None  Discharge Condition: Stable CODE STATUS: Full Diet recommendation: Heart healthy   Brief/Interim Summary: 55 yof with history of HTN, COPD, depression, and hemorrhoids presented with complaints of dizziness which onset one day ago with associated feeling of being off balance. Reports elevated blood pressure which has resolved. CT head negative. She was admitted for further treatment.   Discharge Diagnoses:  Active Problems:   HTN (hypertension)   Dyslipidemia   Ataxia  Pt presented with complaints of dizziness with transient ataxia. She underwent work up with MRI/MRA head which was found to be unremarkable. ECHO EF of 55-60%, noted to be grade 1 diastolic dysfunction. Carotid dopplers did not show any significant stenosis bilaterally. Orthostatics were checked in ED and were negative. Labs were otherwise unremarkable. On further questions, it seems that patient's symptoms may be more of a chronic process. She has been having intermittent episodes for some time now. She has not had any further symptoms in the hospital and feels ready for discharge home. She has been referred to outpatient neurology for further admission. Her neurologic exam is normal at this time. She is felt stable for discharge with further outpatient evaluation.  Lipid panel with LDL of 81, continued on statin.  1. Uncontrolled HTN, On admission antihypertensives were held for permissive hypertension. Home medications resumed on discharge. Blood pressures stable 2. Tobacco abuse.   Discharge  Instructions  Discharge Instructions    Diet - low sodium heart healthy    Complete by:  As directed   Increase activity slowly    Complete by:  As directed       Medication List    STOP taking these medications   hydrochlorothiazide 12.5 MG tablet Commonly known as:  HYDRODIURIL     TAKE these medications   albuterol 108 (90 Base) MCG/ACT inhaler Commonly known as:  PROVENTIL HFA;VENTOLIN HFA Inhale 2 puffs into the lungs every 4 (four) hours as needed for wheezing or shortness of breath.   aspirin 81 MG tablet Take 81 mg by mouth at bedtime.   atorvastatin 10 MG tablet Commonly known as:  LIPITOR Take 10 mg by mouth at bedtime.   bisoprolol-hydrochlorothiazide 10-6.25 MG tablet Commonly known as:  ZIAC Take 1 tablet by mouth daily.   buPROPion 150 MG 12 hr tablet Commonly known as:  WELLBUTRIN SR Take 150 mg by mouth 2 (two) times daily.   CHOLEST CARE 500 MG Caps Take 1 capsule by mouth daily.   GLUCOSAMINE COMPLEX PO Take 1 tablet by mouth daily.   Krill Oil 1000 MG Caps Take 1 capsule by mouth daily.   losartan 50 MG tablet Commonly known as:  COZAAR Take 50 mg by mouth daily.   MULTIPLE VITAMIN PO Take 1 tablet by mouth daily.   ranitidine 150 MG tablet Commonly known as:  ZANTAC Take 150 mg by mouth daily as needed for heartburn.      Follow-up Information    Gwen Pounds, MD. Schedule an appointment as soon as possible for a visit in 1 day(s).   Specialty:  Internal Medicine Contact information: [address] [place]  [place] [address] [phone]        DOONQUAH, KOFI, MD Follow up in 3 week(s).   Specialty:  Neurology Why:  Dr Ronal Fear office will call patient to make the appointment to follow up dizziness. Contact information: [address] [place] [address] [phone]          No Known Allergies  Consultations:  None   Procedures/Studies: Dg Chest 2 View  Result Date: 01/30/2016 CLINICAL DATA:   55 year old female with TIA. EXAM: CHEST  2 VIEW COMPARISON:  Chest radiograph dated 07/01/2015 FINDINGS: The lungs are clear. There is no pleural effusion or pneumothorax. The cardiac silhouette is within normal limits. Lower cervical fixation plate and screws. No acute osseous pathology. IMPRESSION: No active cardiopulmonary disease. Electronically Signed   By: Elgie Collard M.D.   On: 01/30/2016 01:16  Ct Head Wo Contrast  Result Date: 01/29/2016 CLINICAL DATA:  Dizziness since this morning. Elevated blood pressure. EXAM: CT HEAD WITHOUT CONTRAST TECHNIQUE: Contiguous axial images were obtained from the base of the skull through the vertex without intravenous contrast. COMPARISON:  None. FINDINGS: The ventricles are normal in size and configuration. No extra-axial fluid collections are identified. The gray-white differentiation is normal. No CT findings for acute intracranial process such as hemorrhage or infarction. No mass lesions. The brainstem and cerebellum are grossly normal. The bony structures are intact. Mild hyperostosis frontalis interna The paranasal sinuses and mastoid air cells are clear except for a a small amount of fluid in the left half of the sphenoid sinus. The globes are intact. IMPRESSION: No acute intracranial findings or mass lesions. Electronically Signed   By: Rudie Meyer M.D.   On: 01/29/2016 18:33  Mr Maxine Glenn Head Wo Contrast  Result Date: 01/30/2016 CLINICAL DATA:  Headache and dizziness 2 days duration. EXAM: MRI HEAD WITHOUT CONTRAST MRA HEAD WITHOUT CONTRAST TECHNIQUE: Multiplanar, multiecho pulse sequences of the brain and surrounding structures were obtained without intravenous contrast. Angiographic images of the head were obtained using MRA technique without contrast. COMPARISON:  Head CT 01/29/2016 FINDINGS: MRI HEAD FINDINGS Diffusion imaging does not show any acute or subacute infarction. The brainstem and cerebellum are normal. The cerebral hemispheres are normal  with the exception of 2 small foci of T2 and FLAIR signal within the left hemispheric white matter, 1 in the left parietal lobe an the other in the deep white matter adjacent to the atrium of the left lateral ventricle. As isolated findings, these are not likely to be clinically relevant. No cortical or large vessel territory infarction. No mass lesion, hemorrhage, hydrocephalus or extra-axial collection. Small amount of fluid in the left division of the sphenoid sinus, probably not significant. No fluid in the middle ears or mastoids. MRA HEAD FINDINGS Both internal carotid arteries are widely patent into the brain. No siphon stenosis. The anterior and middle cerebral vessels are patent without proximal stenosis, aneurysm or vascular malformation. Both vertebral arteries are widely patent to the basilar. No basilar stenosis. Posterior circulation branch vessels appear normal. IMPRESSION: No acute or reversible finding. No cause of dizziness identified. Normal appearance of the brain with the exception of 2 small white matter foci in the left hemisphere which are not likely to be clinically relevant. Normal intracranial MR angiography of the large and medium size vessels. Electronically Signed   By: Paulina Fusi M.D.   On: 01/30/2016 13:43   Mr Brain Wo Contrast  Result Date: 01/30/2016 CLINICAL DATA:  Headache and dizziness 2 days duration. EXAM: MRI HEAD  WITHOUT CONTRAST MRA HEAD WITHOUT CONTRAST TECHNIQUE: Multiplanar, multiecho pulse sequences of the brain and surrounding structures were obtained without intravenous contrast. Angiographic images of the head were obtained using MRA technique without contrast. COMPARISON:  Head CT 01/29/2016 FINDINGS: MRI HEAD FINDINGS Diffusion imaging does not show any acute or subacute infarction. The brainstem and cerebellum are normal. The cerebral hemispheres are normal with the exception of 2 small foci of T2 and FLAIR signal within the left hemispheric white matter, 1  in the left parietal lobe an the other in the deep white matter adjacent to the atrium of the left lateral ventricle. As isolated findings, these are not likely to be clinically relevant. No cortical or large vessel territory infarction. No mass lesion, hemorrhage, hydrocephalus or extra-axial collection. Small amount of fluid in the left division of the sphenoid sinus, probably not significant. No fluid in the middle ears or mastoids. MRA HEAD FINDINGS Both internal carotid arteries are widely patent into the brain. No siphon stenosis. The anterior and middle cerebral vessels are patent without proximal stenosis, aneurysm or vascular malformation. Both vertebral arteries are widely patent to the basilar. No basilar stenosis. Posterior circulation branch vessels appear normal. IMPRESSION: No acute or reversible finding. No cause of dizziness identified. Normal appearance of the brain with the exception of 2 small white matter foci in the left hemisphere which are not likely to be clinically relevant. Normal intracranial MR angiography of the large and medium size vessels. Electronically Signed   By: Paulina Fusi M.D.   On: 01/30/2016 13:43   US Carotid Bilateral (at [facility] Only)  Result Date: 01/30/2016 CLINICAL DATA:  55 year old female with a history of lightheadedness. Cardiovascular risk factors include hypertension, stroke/ TIA, hyperlipidemia, tobacco use EXAM: BILATERAL CAROTID DUPLEX ULTRASOUND TECHNIQUE: Wallace Cullens scale imaging, color Doppler and duplex ultrasound were performed of bilateral carotid and vertebral arteries in the neck. FINDINGS: Criteria: Quantification of carotid stenosis is based on velocity parameters that correlate the residual internal carotid diameter with NASCET-based stenosis levels, using the diameter of the distal internal carotid lumen as the denominator for stenosis measurement. The following velocity measurements were obtained: RIGHT ICA:  Systolic 86 cm/sec, Diastolic 39  cm/sec CCA:  88 cm/sec SYSTOLIC ICA/CCA RATIO:  1.0 ECA:  90 cm/sec LEFT ICA:  Systolic 87 cm/sec, Diastolic 34 cm/sec CCA:  82 cm/sec SYSTOLIC ICA/CCA RATIO:  1.20 ECA:  107 cm/sec Right Brachial SBP: Not acquired Left Brachial SBP: Not acquired RIGHT CAROTID ARTERY: No significant calcified disease of the right common carotid artery. Intermediate waveform maintained. Heterogeneous plaque without significant calcifications at the right carotid bifurcation. Low resistance waveform of the right ICA. No significant tortuosity. RIGHT VERTEBRAL ARTERY: Antegrade flow with low resistance waveform. LEFT CAROTID ARTERY: No significant calcified disease of the left common carotid artery. Intermediate waveform maintained. Heterogeneous plaque at the left carotid bifurcation without significant calcifications. Low resistance waveform of the left ICA. LEFT VERTEBRAL ARTERY:  Antegrade flow with low resistance waveform. IMPRESSION: Color duplex indicates minimal heterogeneous plaque, with no hemodynamically significant stenosis by duplex criteria in the extracranial cerebrovascular circulation. Signed, Yvone Neu. Loreta Ave, DO [facility] Electronically Signed   By: Gilmer Mor D.O.   On: 01/30/2016 14:36  ECHO  Study Conclusions  - Left ventricle: The cavity size was normal. Wall thickness was   normal. Systolic function was normal. The estimated ejection   fraction was in the range of 55% to 60%. Wall motion was normal;  there were no regional wall motion abnormalities. Doppler   parameters are consistent with abnormal left ventricular   relaxation (grade 1 diastolic dysfunction).    Subjective: Dizziness began yesterday morning, light headed, trouble walking  reoccurs every 2-3 days on exertion. Denies dizziness when turning head or standing up. Does not feel that room is spinning, but rather a lightheadedness. Denies left sided weakness and numbness.  States she had tingling in left arm radiating toward hand, but this is also a chronic finding since her neck surgery. Felt nauseas upon arrival.   Discharge Exam: Vitals:   01/30/16 1014 01/30/16 1445  BP: 137/79 138/80  Pulse: 74 64  Resp: 18   Temp: 98.1 F (36.7 C) 97.7 F (36.5 C)   Vitals:   01/30/16 0614 01/30/16 0814 01/30/16 1014 01/30/16 1445  BP: (!) 144/71 (!) 147/75 137/79 138/80  Pulse: 63 70 74 64  Resp: 16 18 18    Temp: 98 F (36.7 C) 97.6 F (36.4 C) 98.1 F (36.7 C) 97.7 F (36.5 C)  TempSrc: Oral Oral Oral Oral  SpO2: 95% 100% 96% 98%  Weight:      Height:        General: Pt is alert, awake, not in acute distress Cardiovascular: RRR, S1/S2 +, no rubs, no gallops Respiratory: CTA bilaterally, no wheezing, no rhonchi Abdominal: Soft, NT, ND, bowel sounds + Extremities: no edema, no cyanosis    The results of significant diagnostics from this hospitalization (including imaging, microbiology, ancillary and laboratory) are listed below for reference.     Microbiology: No results found for this or any previous visit (from the past 240 hour(s)).   Labs: BNP (last 3 results) No results for input(s): BNP in the last 8760 hours. Basic Metabolic Panel:  Recent Labs Lab 01/29/16 1439  NA 136  K 3.9  CL 104  CO2 27  GLUCOSE 119*  BUN 13  CREATININE 0.99  CALCIUM 9.2   Liver Function Tests: No results for input(s): AST, ALT, ALKPHOS, BILITOT, PROT, ALBUMIN in the last 168 hours. No results for input(s): LIPASE, AMYLASE in the last 168 hours. No results for input(s): AMMONIA in the last 168 hours. CBC:  Recent Labs Lab 01/29/16 1439  WBC 7.2  HGB 14.7  HCT 44.5  MCV 93.3  PLT 299   Cardiac Enzymes:  Recent Labs Lab 01/29/16 1751 01/29/16 2345 01/30/16 0645 01/30/16 1117  TROPONINI <0.03 <0.03 <0.03 <0.03   BNP: Invalid input(s): POCBNP CBG: No results for input(s): GLUCAP in the last 168 hours. D-Dimer No results for  input(s): DDIMER in the last 72 hours. Hgb A1c No results for input(s): HGBA1C in the last 72 hours. Lipid Profile  Recent Labs  01/30/16 0645  CHOL 156  HDL 39*  LDLCALC 81  TRIG 161*  CHOLHDL 4.0   Thyroid function studies No results for input(s): TSH, T4TOTAL, T3FREE, THYROIDAB in the last 72 hours.  Invalid input(s): FREET3 Anemia work up No results for input(s): VITAMINB12, FOLATE, FERRITIN, TIBC, IRON, RETICCTPCT in the last 72 hours. Urinalysis No results found for: COLORURINE, APPEARANCEUR, LABSPEC, PHURINE, GLUCOSEU, HGBUR, BILIRUBINUR, KETONESUR, PROTEINUR, UROBILINOGEN, NITRITE, LEUKOCYTESUR Sepsis Labs Invalid input(s): PROCALCITONIN,  WBC,  LACTICIDVEN Microbiology No results found for this or any previous visit (from the past 240 hour(s)).   Time coordinating discharge: Over 30 minutes  SIGNED:   Erick Blinks, MD   [facility] 01/30/2016, 4:15 PM If 7PM-7AM, please contact night-coverage [email or web address] Password [email or web address]  By signing my name below, I, Hailei  Victoriano Lain, attest that this documentation has been prepared under the direction and in the presence of Erick Blinks, MD. Electronically signed: Cynda Acres, Scribe. 01/30/16 12:00PM   I, Dr. Erick Blinks, personally performed the services described in this documentaiton. All medical record entries made by the scribe were at my direction and in my presence. I have reviewed the chart and agree that the record reflects my personal performance and is accurate and complete  Erick Blinks, MD, 01/30/2016 4:15 PM

## 2016-01-30 NOTE — Progress Notes (Signed)
*  PRELIMINARY RESULTS* Echocardiogram 2D Echocardiogram has been performed.  Stacey Drain 01/30/2016, 2:58 PM

## 2016-01-30 NOTE — Evaluation (Signed)
Physical Therapy Evaluation Patient Details Name: Natasha [name] MRN: [id number] DOB: [birth year] Today's Date: 01/30/2016   History of Present Illness  55 yo F admitted 01/29/2016 due to dizziness.  Dx: Uncontrolled HTN, ataxia, and TIA. CT of the head (-), awaiting MRI. PMH: HTN, COPD, depression, DDD - lumbosacral, Vitamin D deficiency, cervical disc surgery, trigger finger release.    Clinical Impression  Pt received in bed, and was agreeable to PT evaluation.  Pt is normally independent with ambulation, ADL's, and IADL's.  Today she demonstrated independence with functional bed mobility, transfers and gait x 529ft with no LOB, however she stated that she felt like she was still "off to the left side."  Pt does not demonstrate need for skilled therapeutic intervention at this time, therefore, will sign off.     Follow Up Recommendations No PT follow up    Equipment Recommendations  None recommended by PT    Recommendations for Other Services       Precautions / Restrictions Precautions Precautions: None Restrictions Weight Bearing Restrictions: No      Mobility  Bed Mobility Overal bed mobility: Modified Independent                Transfers Overall transfer level: Independent Equipment used: None                Ambulation/Gait Ambulation/Gait assistance: Independent Ambulation Distance (Feet): 500 Feet Assistive device: None Gait Pattern/deviations: WFL(Within Functional Limits)     General Gait Details: Pt demonstrated good cadence, and no LOB noted   Stairs            Wheelchair Mobility    Modified Rankin (Stroke Patients Only)       Balance Overall balance assessment: No apparent balance deficits (not formally assessed)                                           Pertinent Vitals/Pain Pain Assessment: No/denies pain    Home Living   Living Arrangements: Alone   Type of Home: House Home Access: Stairs to  enter   Entrance Stairs-Number of Steps: 1 small step Home Layout: One level Home Equipment: None      Prior Function Level of Independence: Independent         Comments: still driving, 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      Hand Dominance   Dominant Hand: Right    Extremity/Trunk Assessment   Upper Extremity Assessment: Overall WFL for tasks assessed           Lower Extremity Assessment: Overall WFL for tasks assessed         Communication   Communication: No difficulties  Cognition Arousal/Alertness: Awake/alert Behavior During Therapy: WFL for tasks assessed/performed Overall Cognitive Status: Within Functional Limits for tasks assessed                      General Comments      Exercises        Assessment/Plan    PT Assessment Patent does not need any further PT services  PT Diagnosis Abnormality of gait   PT Problem List    PT Treatment Interventions     PT Goals (Current goals can be found in the Care Plan section) Acute Rehab PT Goals PT Goal Formulation: All assessment and education complete,  DC therapy    Frequency     Barriers to discharge        Co-evaluation               End of Session Equipment Utilized During Treatment: Gait belt Activity Tolerance: Patient tolerated treatment well Patient left: in chair           Time: 8502-7741 PT Time Calculation (min) (ACUTE ONLY): 21 min   Charges:   PT Evaluation $PT Eval Low Complexity: 1 Procedure     PT G Codes:        Beth Kase Shughart, PT, DPT X: [id number]

## 2016-01-30 NOTE — Care Management Note (Signed)
Case Management Note  Patient Details  Name: Natasha [name] MRN: [id number] Date of Birth: 1960/12/12  Subjective/Objective: Patient adm from home with ataxia. She is independent with ADL's. Still working as an Airline pilot. She has a PCP and insurance, reports no issues.                          Action/Plan: Anticipate DC home with self care.    Expected Discharge Date:  01/30/16               Expected Discharge Plan:  Home/Self Care  In-House Referral:  NA  Discharge planning Services  CM Consult  Post Acute Care Choice:  NA Choice offered to:  NA  DME Arranged:    DME Agency:     HH Arranged:    HH Agency:     Status of Service:  Completed, signed off  If discussed at [employer] of Stay Meetings, dates discussed:    Additional Comments:  Della Homan, Chrystine Oiler, RN 01/30/2016, 11:46 AM

## 2016-01-30 NOTE — Progress Notes (Signed)
Discharge instructions read to patient .  Patient verbalized understanding of all instructions.  Discharged to home with self 

## 2016-01-30 NOTE — Progress Notes (Signed)
SLP Cancellation Note  Patient Details Name: Natasha [name] MRN: [id number] DOB: [birth year]   Cancelled treatment:       Reason Eval/Treat Not Completed: SLP screened, no needs identified, will sign off   PORTER,DABNEY 01/30/2016, 4:26 PM

## 2016-01-31 LAB — HEMOGLOBIN A1C
Hgb A1c MFr Bld: 5.4 % (ref 4.8–5.6)
Mean Plasma Glucose: 108 mg/dL

## 2017-04-19 ENCOUNTER — Other Ambulatory Visit (HOSPITAL_COMMUNITY): Payer: Self-pay | Admitting: Neurosurgery

## 2017-04-19 DIAGNOSIS — M5416 Radiculopathy, lumbar region: Secondary | ICD-10-CM

## 2017-05-01 ENCOUNTER — Ambulatory Visit (HOSPITAL_COMMUNITY): Payer: BLUE CROSS/BLUE SHIELD

## 2017-05-31 ENCOUNTER — Ambulatory Visit: Payer: Self-pay | Admitting: Internal Medicine

## 2017-06-22 DIAGNOSIS — J321 Chronic frontal sinusitis: Secondary | ICD-10-CM | POA: Diagnosis not present

## 2017-07-09 ENCOUNTER — Ambulatory Visit (HOSPITAL_COMMUNITY)
Admission: RE | Admit: 2017-07-09 | Discharge: 2017-07-09 | Disposition: A | Discharge status: Home / Self Care | Payer: BLUE CROSS/BLUE SHIELD | Source: Ambulatory Visit | Attending: Neurosurgery | Admitting: Neurosurgery

## 2017-07-09 DIAGNOSIS — M5416 Radiculopathy, lumbar region: Secondary | ICD-10-CM

## 2017-07-10 ENCOUNTER — Ambulatory Visit (HOSPITAL_COMMUNITY)
Admission: RE | Admit: 2017-07-10 | Discharge: 2017-07-10 | Disposition: A | Discharge status: Home / Self Care | Payer: BLUE CROSS/BLUE SHIELD | Source: Ambulatory Visit | Attending: Neurosurgery | Admitting: Neurosurgery

## 2017-07-10 DIAGNOSIS — M545 Low back pain: Secondary | ICD-10-CM | POA: Diagnosis not present

## 2017-07-10 DIAGNOSIS — M5416 Radiculopathy, lumbar region: Secondary | ICD-10-CM | POA: Diagnosis not present

## 2017-07-10 DIAGNOSIS — M5186 Other intervertebral disc disorders, lumbar region: Secondary | ICD-10-CM | POA: Insufficient documentation

## 2017-07-10 DIAGNOSIS — M48061 Spinal stenosis, lumbar region without neurogenic claudication: Secondary | ICD-10-CM | POA: Diagnosis not present

## 2017-07-10 DIAGNOSIS — M2578 Osteophyte, vertebrae: Secondary | ICD-10-CM | POA: Diagnosis not present

## 2017-07-23 ENCOUNTER — Other Ambulatory Visit (HOSPITAL_COMMUNITY): Payer: Self-pay | Admitting: Internal Medicine

## 2017-07-23 DIAGNOSIS — J449 Chronic obstructive pulmonary disease, unspecified: Secondary | ICD-10-CM

## 2017-07-23 DIAGNOSIS — J4489 Other specified chronic obstructive pulmonary disease: Secondary | ICD-10-CM

## 2017-07-24 DIAGNOSIS — I10 Essential (primary) hypertension: Secondary | ICD-10-CM | POA: Diagnosis not present

## 2017-07-24 DIAGNOSIS — M5137 Other intervertebral disc degeneration, lumbosacral region: Secondary | ICD-10-CM | POA: Diagnosis not present

## 2017-07-24 DIAGNOSIS — M5416 Radiculopathy, lumbar region: Secondary | ICD-10-CM | POA: Diagnosis not present

## 2017-07-24 DIAGNOSIS — M545 Low back pain: Secondary | ICD-10-CM | POA: Diagnosis not present

## 2017-08-01 DIAGNOSIS — Z6826 Body mass index (BMI) 26.0-26.9, adult: Secondary | ICD-10-CM | POA: Diagnosis not present

## 2017-08-01 DIAGNOSIS — Z1231 Encounter for screening mammogram for malignant neoplasm of breast: Secondary | ICD-10-CM | POA: Diagnosis not present

## 2017-08-01 DIAGNOSIS — Z01419 Encounter for gynecological examination (general) (routine) without abnormal findings: Secondary | ICD-10-CM | POA: Diagnosis not present

## 2017-08-08 ENCOUNTER — Ambulatory Visit (HOSPITAL_COMMUNITY): Admission: RE | Admit: 2017-08-08 | Payer: BLUE CROSS/BLUE SHIELD | Source: Ambulatory Visit

## 2017-08-20 ENCOUNTER — Ambulatory Visit (HOSPITAL_COMMUNITY): Payer: BLUE CROSS/BLUE SHIELD

## 2017-09-05 ENCOUNTER — Ambulatory Visit (HOSPITAL_COMMUNITY)
Admission: RE | Admit: 2017-09-05 | Discharge: 2017-09-05 | Disposition: A | Discharge status: Home / Self Care | Payer: BLUE CROSS/BLUE SHIELD | Source: Ambulatory Visit | Attending: Internal Medicine | Admitting: Internal Medicine

## 2017-09-05 DIAGNOSIS — J432 Centrilobular emphysema: Secondary | ICD-10-CM | POA: Insufficient documentation

## 2017-09-05 DIAGNOSIS — J439 Emphysema, unspecified: Secondary | ICD-10-CM | POA: Insufficient documentation

## 2017-09-05 DIAGNOSIS — I700 Atherosclerosis of aorta: Secondary | ICD-10-CM | POA: Insufficient documentation

## 2017-09-05 DIAGNOSIS — F17210 Nicotine dependence, cigarettes, uncomplicated: Secondary | ICD-10-CM | POA: Diagnosis not present

## 2017-09-05 DIAGNOSIS — J449 Chronic obstructive pulmonary disease, unspecified: Secondary | ICD-10-CM

## 2017-09-24 DIAGNOSIS — I10 Essential (primary) hypertension: Secondary | ICD-10-CM | POA: Diagnosis not present

## 2017-09-24 DIAGNOSIS — F419 Anxiety disorder, unspecified: Secondary | ICD-10-CM | POA: Diagnosis not present

## 2017-09-24 DIAGNOSIS — Z1389 Encounter for screening for other disorder: Secondary | ICD-10-CM | POA: Diagnosis not present

## 2017-09-24 DIAGNOSIS — Z0000 Encounter for general adult medical examination without abnormal findings: Secondary | ICD-10-CM | POA: Diagnosis not present

## 2017-09-24 DIAGNOSIS — Z23 Encounter for immunization: Secondary | ICD-10-CM | POA: Diagnosis not present

## 2017-09-24 DIAGNOSIS — J449 Chronic obstructive pulmonary disease, unspecified: Secondary | ICD-10-CM | POA: Diagnosis not present

## 2017-09-24 DIAGNOSIS — F17210 Nicotine dependence, cigarettes, uncomplicated: Secondary | ICD-10-CM | POA: Diagnosis not present

## 2017-09-25 DIAGNOSIS — M47817 Spondylosis without myelopathy or radiculopathy, lumbosacral region: Secondary | ICD-10-CM | POA: Diagnosis not present

## 2017-10-15 DIAGNOSIS — M47817 Spondylosis without myelopathy or radiculopathy, lumbosacral region: Secondary | ICD-10-CM | POA: Diagnosis not present

## 2017-10-24 DIAGNOSIS — F331 Major depressive disorder, recurrent, moderate: Secondary | ICD-10-CM | POA: Diagnosis not present

## 2017-11-04 DIAGNOSIS — M47817 Spondylosis without myelopathy or radiculopathy, lumbosacral region: Secondary | ICD-10-CM | POA: Diagnosis not present

## 2017-11-07 DIAGNOSIS — F331 Major depressive disorder, recurrent, moderate: Secondary | ICD-10-CM | POA: Diagnosis not present

## 2017-11-14 DIAGNOSIS — F331 Major depressive disorder, recurrent, moderate: Secondary | ICD-10-CM | POA: Diagnosis not present

## 2017-11-21 DIAGNOSIS — F331 Major depressive disorder, recurrent, moderate: Secondary | ICD-10-CM | POA: Diagnosis not present

## 2017-11-27 DIAGNOSIS — F331 Major depressive disorder, recurrent, moderate: Secondary | ICD-10-CM | POA: Diagnosis not present

## 2017-12-02 DIAGNOSIS — M47817 Spondylosis without myelopathy or radiculopathy, lumbosacral region: Secondary | ICD-10-CM | POA: Diagnosis not present

## 2017-12-03 DIAGNOSIS — Z1212 Encounter for screening for malignant neoplasm of rectum: Secondary | ICD-10-CM | POA: Diagnosis not present

## 2017-12-05 DIAGNOSIS — F331 Major depressive disorder, recurrent, moderate: Secondary | ICD-10-CM | POA: Diagnosis not present

## 2017-12-12 DIAGNOSIS — F331 Major depressive disorder, recurrent, moderate: Secondary | ICD-10-CM | POA: Diagnosis not present

## 2017-12-19 DIAGNOSIS — F331 Major depressive disorder, recurrent, moderate: Secondary | ICD-10-CM | POA: Diagnosis not present

## 2018-01-01 DIAGNOSIS — F331 Major depressive disorder, recurrent, moderate: Secondary | ICD-10-CM | POA: Diagnosis not present

## 2018-01-09 DIAGNOSIS — L821 Other seborrheic keratosis: Secondary | ICD-10-CM | POA: Diagnosis not present

## 2018-01-09 DIAGNOSIS — B351 Tinea unguium: Secondary | ICD-10-CM | POA: Diagnosis not present

## 2018-01-20 ENCOUNTER — Telehealth: Payer: Self-pay

## 2018-01-20 NOTE — Telephone Encounter (Signed)
Received a VM, pt wanted to know when her next TCS was. Returned call, pt is due for her TCS 01/01/20. Pt was notified.
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
## 2018-02-03 ENCOUNTER — Ambulatory Visit (INDEPENDENT_AMBULATORY_CARE_PROVIDER_SITE_OTHER): Payer: BLUE CROSS/BLUE SHIELD | Admitting: Psychiatry

## 2018-02-03 ENCOUNTER — Encounter (HOSPITAL_COMMUNITY): Payer: Self-pay | Admitting: Psychiatry

## 2018-02-03 VITALS — BP 137/84 | HR 60 | Ht 70.0 in | Wt 181.0 lb

## 2018-02-03 DIAGNOSIS — F331 Major depressive disorder, recurrent, moderate: Secondary | ICD-10-CM

## 2018-02-03 DIAGNOSIS — F1020 Alcohol dependence, uncomplicated: Secondary | ICD-10-CM | POA: Diagnosis not present

## 2018-02-03 MED ORDER — SERTRALINE HCL 50 MG PO TABS: ORAL_TABLET | ORAL | 0 refills | Status: DC

## 2018-02-03 NOTE — Patient Instructions (Addendum)
1. Start sertraline 25 mg daily for one week, then 50 mg daily  2. Continue Wellbutrin 150 mg twice a day  3. Return to clinic in one month for 30 mins  CONTACT INFORMATION  What to do if you need to get in touch with someone regarding a psychiatric issue:  1. EMERGENCY: For psychiatric emergencies (if you are suicidal or if there are any other safety issues) call 911 and/or go to your nearest Emergency Room immediately.   2. IF YOU NEED SOMEONE TO TALK TO RIGHT NOW: Given my clinical responsibilities, I may not be able to speak with you over the phone for a prolonged period of time.  a. You may always call The National Suicide Prevention Lifeline at 1-800-273-TALK 269-573-0510(8255).  b. Your county of residence will also have local crisis services. [place]: [facility] at [phone] 733 4674 (24 Hour Crisis Hotline)

## 2018-02-11 ENCOUNTER — Ambulatory Visit (HOSPITAL_COMMUNITY): Payer: BLUE CROSS/BLUE SHIELD | Attending: Neurosurgery | Admitting: Physical Therapy

## 2018-02-11 ENCOUNTER — Other Ambulatory Visit: Payer: Self-pay

## 2018-02-11 ENCOUNTER — Encounter (HOSPITAL_COMMUNITY): Payer: Self-pay | Admitting: Physical Therapy

## 2018-02-11 DIAGNOSIS — R29898 Other symptoms and signs involving the musculoskeletal system: Secondary | ICD-10-CM

## 2018-02-11 DIAGNOSIS — M545 Low back pain: Secondary | ICD-10-CM | POA: Diagnosis not present

## 2018-02-11 DIAGNOSIS — M6281 Muscle weakness (generalized): Secondary | ICD-10-CM

## 2018-02-11 DIAGNOSIS — G8929 Other chronic pain: Secondary | ICD-10-CM | POA: Diagnosis not present

## 2018-02-11 NOTE — Therapy (Signed)
[facility] [address] [place], [place], [address] Phone: [phone]   Fax:  [phone]  Physical Therapy Evaluation  Patient Details  Name: Natasha [name] MRN: [id number] Date of Birth: [birth year] Referring Provider: Maeola Harman MD   Encounter Date: 02/11/2018  PT End of Session - 02/11/18 1559    Visit Number  1    Number of Visits  13    Date for PT Re-Evaluation  03/25/18   Mini re-assess 03/04/18   Authorization Type  BCBS other    Authorization Time Period  02/11/18 - 03/25/18    Authorization - Visit Number  1    Authorization - Number of Visits  30    PT Start Time  1125    PT Stop Time  1202    PT Time Calculation (min)  37 min    Activity Tolerance  Patient tolerated treatment well    Behavior During Therapy  [facility] for tasks assessed/performed       Past Medical History:  Diagnosis Date  . Arthritis   . COPD (chronic obstructive pulmonary disease) (HCC)   . DDD (degenerative disc disease)   . DDD (degenerative disc disease), lumbosacral   . Depression   . Hemorrhoids   . Hyperlipidemia   . Hypertension   . Vitamin D deficiency     Past Surgical History:  Procedure Laterality Date  . CERVICAL DISC SURGERY  12/2010   Dr. Franky Macho  . MOUTH SURGERY    . TONSILLECTOMY     as teenager, in [place]  . TRIGGER FINGER RELEASE Left 04/16/2013   Procedure: RELEASE TRIGGER FINGER/A-1 PULLEY LEFT THUMB;  Surgeon: Wyn Forster., MD;  Location: [facility];  Service: Orthopedics;  Laterality: Left;  Marland Kitchen VAGINAL HYSTERECTOMY  06/2000   partial, Dr. Jeanine Luz    There were no vitals filed for this visit.   Subjective Assessment - 02/11/18 1138    Subjective  Patient stated that she has had medial lower back pain for many years, since she was 36. She reported that her back hurts 24/7 and that her only relief has been the steroid shot but that the relief was only temporary. Patient reported that over the  last week her maximum pain has been a 7/10. Patient reported that she had 2 steroid shots in her back in April and May and then she had a nerve block on the right side. She stated that after the nerve block her back pain got worse, but eventually it returned to the usual pain she has had. Patient stated that she has degenerative disc disease. Patient denied any tingling or numbness and denied any bowel and bladder changes.     Pertinent History  Cervical disc surgery 2012, COPD, HTN    Limitations  Standing;Walking;Sitting;Lifting;House hold activities    How long can you sit comfortably?  30 minutes    How long can you stand comfortably?  30 minutes    How long can you walk comfortably?  20 minutes    Diagnostic tests  MRI: Severe bilateral L5-S1 stenosis, L4-5 foraminal stenosis, L3-4 right foraminal annular fissure with associate stenosis    Patient Stated Goals  Strengthen her abdominals and back    Currently in Pain?  Yes    Pain Score  5     Pain Location  Back    Pain Orientation  Lower;Medial    Pain Descriptors / Indicators  Aching    Pain  Type  Chronic pain    Pain Onset  More than a month ago    Pain Frequency  Constant    Aggravating Factors   Sitting a long time, standing a long time    Pain Relieving Factors  stretching it out     Effect of Pain on Daily Activities  Moderately affects         [facility] PT Assessment - 02/11/18 0001      Assessment   Medical Diagnosis  Radiculopathy, Lumbar Region    Referring Provider  Maeola Harman MD    Onset Date/Surgical Date  --   Many years   Next MD Visit  03/16/18    Prior Therapy  Yes, a long time ago      Precautions   Precautions  None      Balance Screen   Has the patient fallen in the past 6 months  No    Has the patient had a decrease in activity level because of a fear of falling?   Yes    Is the patient reluctant to leave their home because of a fear of falling?   No      Home Environment   Living Environment   Private residence    Living Arrangements  Alone    Type of Home  House    Home Access  Level entry    Home Layout  One level      Prior Function   Level of Independence  Independent;Independent with basic ADLs    Vocation  Unemployed      Cognition   Overall Cognitive Status  Within Functional Limits for tasks assessed      Observation/Other Assessments   Focus on Therapeutic Outcomes (FOTO)   46% (54% limited)      Sensation   Light Touch  Not tested   Patient denied any numbness or tingling     Posture/Postural Control   Posture/Postural Control  Postural limitations    Postural Limitations  Rounded Shoulders;Forward head;Increased thoracic kyphosis      ROM / Strength   AROM / PROM / Strength  AROM;Strength      AROM   Overall AROM Comments  Hip IR/ER appeared WNL.     AROM Assessment Site  Lumbar    Lumbar Flexion  50% limited    Lumbar Extension  25% limited   feels like it relieves pain some   Lumbar - Right Side Bend  25% limited   Painful   Lumbar - Left Side Bend  25% limited   painful   Lumbar - Right Rotation  WNL   painful   Lumbar - Left Rotation  WNL   painful     Strength   Strength Assessment Site  Hip;Knee;Ankle    Right/Left Hip  Right;Left    Right Hip Flexion  4/5    Right Hip Extension  4/5    Right Hip ABduction  4/5    Left Hip Flexion  4/5    Left Hip Extension  4/5    Left Hip ABduction  4/5    Right/Left Knee  Right;Left    Right Knee Flexion  4+/5    Right Knee Extension  4+/5    Left Knee Flexion  4+/5    Left Knee Extension  4+/5    Right/Left Ankle  Right;Left    Right Ankle Dorsiflexion  4+/5    Left Ankle Dorsiflexion  4+/5      Flexibility  Soft Tissue Assessment /Muscle Length  yes    Hamstrings  25% limited bilaterally      Palpation   Spinal mobility  Noted hypomobility through thoracic spine with central PA assessment compared to lumbar spine    Palpation comment   noted increased muscular restrictions through  bilateral lumbar paraspinals      Special Tests    Special Tests  Hip Special Tests    Other special tests  --    Hip Special Tests   Hip Scouring      Hip Scouring   Findings  Negative    Side  --   Bilaterally   Comments  Negative left and right      Balance   Balance Assessed  Yes      Static Standing Balance   Static Standing - Balance Support  No upper extremity supported    Static Standing Balance -  Activities   Single Leg Stance - Right Leg;Single Leg Stance - Left Leg    Static Standing - Comment/# of Minutes  Left: 27 seconds, Right: 16 seconds      Standardized Balance Assessment   Standardized Balance Assessment  Five Times Sit to Stand    Five times sit to stand comments   18.01 seconds without upper extremity assistance form standard height chair                Objective measurements completed on examination: See above findings.              PT Education - 02/11/18 1559    Education Details  Patient was educated on examination findings and plan of care.     Person(s) Educated  Patient    Methods  Explanation    Comprehension  Verbalized understanding       PT Short Term Goals - 02/11/18 1614      PT SHORT TERM GOAL #1   Title  Patient will demonstrate understanding and report regular compliance with HEP to improve strength, mobility, and decrease pain.     Time  3    Period  Weeks    Status  New    Target Date  03/04/18      PT SHORT TERM GOAL #2   Title  Patient will demonstrate an improvement of 1/2 MMT grade in all muscle groups tested as deficient to assist patient with functional mobility.     Time  3    Period  Weeks    Status  New    Target Date  03/04/18      PT SHORT TERM GOAL #3   Title  Patient will report that her pain level has not exceeded a 5/10 over the course of a 1 week period, indicating better tolerance to daily activities.     Time  3    Period  Weeks    Status  New    Target Date  03/04/18        PT  Long Term Goals - 02/11/18 1619      PT LONG TERM GOAL #1   Title  Patient will demonstrate an improvement of 1 MMT grade in all muscle groups tested as deficient to assist patient with functional mobility.     Time  6    Period  Weeks    Status  New    Target Date  03/25/18      PT LONG TERM GOAL #2   Title  Patient will report that her  pain level has not exceeded a 3/10 over the course of a 1 week period, indicating better tolerance to daily activities.     Time  6    Period  Weeks    Status  New    Target Date  03/25/18      PT LONG TERM GOAL #3   Title  Patient will demonstrate ability to maintain single limb stance for 30 seconds on each lower extremity indicating improved strength and stability.     Time  6    Period  Weeks    Status  New    Target Date  03/25/18      PT LONG TERM GOAL #4   Title  Patient will perform the 5xSTS test in 13 seconds or less indicating improved balance and overall functional mobility.     Time  6    Period  Weeks    Status  New    Target Date  03/25/18             Plan - 02/11/18 1630    Clinical Impression Statement  Patient is a 57 year old female who presented to physical therapy with the primary complaint of low back pain which has been on-going for many years. Upon examination noted that patient had decreased lumbar ROM and decreased lower extremity strength. Patient also demonstrated decreased balance with single limb stance and with the 5xSTS test. Patient's hip IR/ER ROM appeared normal and scour tests were negative bilaterally. Patient had decreased hamstring flexibility on bilateral lower extremities. In addition, with palpation noted hypomobility of patient's thoracic spine as well as increased lumbar paraspinal muscular restrictions. In addition, noted that patient had several deficits in posture including forward head, rounded shoulders, and significant thoracic kyphosis. Patient would benefit from skilled physical therapy in  order to address the abovementioned deficits and help patient return to prior level of function.      History and Personal Factors relevant to plan of care:  Cervical surgery 2012, COPD, HTN    Clinical Presentation  Stable    Clinical Presentation due to:  FOTO, 5xSTS, MMT, ROM, clinical judgement    Clinical Decision Making  Low    Rehab Potential  Fair    Clinical Impairments Affecting Rehab Potential  Positive: Motivated,  Negative: Chronicity    PT Frequency  2x / week    PT Duration  6 weeks    PT Treatment/Interventions  ADLs/Self Care Home Management;Aquatic Therapy;Electrical Stimulation;Moist Heat;DME Instruction;Gait training;Stair training;Functional mobility training;Therapeutic activities;Therapeutic exercise;Balance training;Neuromuscular re-education;Patient/family education;Manual techniques;Passive range of motion;Dry needling;Energy conservation;Taping    PT Next Visit Plan  Review evaluation and goals, Initiate HEP focusing on lumbar stabilzation more extension based stretches, initiate lumbar stabilization and functional lower extremity strengthening.     PT Home Exercise Plan  Initiate at first treatment    Consulted and Agree with Plan of Care  Patient       Patient will benefit from skilled therapeutic intervention in order to improve the following deficits and impairments:  Decreased balance, Decreased endurance, Decreased mobility, Hypomobility, Difficulty walking, Increased muscle spasms, Decreased range of motion, Improper body mechanics, Decreased activity tolerance, Decreased strength, Increased fascial restricitons, Impaired flexibility, Postural dysfunction, Pain  Visit Diagnosis: Chronic midline low back pain, with sciatica presence unspecified  Muscle weakness (generalized)  Other symptoms and signs involving the musculoskeletal system     Problem List Patient Active Problem List   Diagnosis Date Noted  . Ataxia 01/29/2016  . Chest pain  08/10/2015  .  Dyspnea 08/10/2015  . Chronic bronchitis (HCC) 08/10/2015  . Tobacco abuse 08/10/2015  . Essential hypertension 08/10/2015  . Dyslipidemia 08/10/2015  . Chest pain with low risk for cardiac etiology 07/01/2015  . ABDOMINAL BLOATING 12/13/2009  . DIARRHEA 12/13/2009  . CHANGE IN BOWELS 12/13/2009  . BURSITIS, SHOULDER 06/19/2007  . HTN (hypertension) 06/19/2007   Verne CarrowMacy Ansh Fauble PT, DPT 4:35 PM, 02/11/18 [phone]  [facility] [address] [place], [place], [address] Phone: [phone]   Fax:  [phone]  Name: Natasha [name] MRN: [id number] Date of Birth: 1961/03/02

## 2018-02-14 ENCOUNTER — Ambulatory Visit (HOSPITAL_COMMUNITY): Payer: BLUE CROSS/BLUE SHIELD | Admitting: Physical Therapy

## 2018-02-14 ENCOUNTER — Encounter (HOSPITAL_COMMUNITY): Payer: Self-pay | Admitting: Physical Therapy

## 2018-02-14 DIAGNOSIS — M6281 Muscle weakness (generalized): Secondary | ICD-10-CM

## 2018-02-14 DIAGNOSIS — G8929 Other chronic pain: Secondary | ICD-10-CM | POA: Diagnosis not present

## 2018-02-14 DIAGNOSIS — R29898 Other symptoms and signs involving the musculoskeletal system: Secondary | ICD-10-CM | POA: Diagnosis not present

## 2018-02-14 DIAGNOSIS — M545 Low back pain: Secondary | ICD-10-CM | POA: Diagnosis not present

## 2018-02-14 NOTE — Therapy (Addendum)
[facility] [address] [place], [place], [address] Phone: [phone]   Fax:  [phone]  Physical Therapy Treatment  Patient Details  Name: Natasha [name] MRN: [id number] Date of Birth: September 08, 1960 Referring Provider: Maeola HarmanStern, Joseph MD   Encounter Date: 02/14/2018     02/14/18 1123  PT Visits / Re-Eval  Visit Number 2  Number of Visits 13  Date for PT Re-Evaluation 03/25/18 (Mini re-assess 03/04/18)  Authorization  Authorization Type BCBS other  Authorization Time Period 02/11/18 - 03/25/18  Authorization - Visit Number 1  Authorization - Number of Visits 30  PT Time Calculation  PT Start Time 1305  PT Stop Time 1343  PT Time Calculation (min) 38 min  PT - End of Session  Activity Tolerance Patient tolerated treatment well  Behavior During Therapy [facility] for tasks assessed/performed      Past Medical History:  Diagnosis Date  . Arthritis   . COPD (chronic obstructive pulmonary disease) (HCC)   . DDD (degenerative disc disease)   . DDD (degenerative disc disease), lumbosacral   . Depression   . Hemorrhoids   . Hyperlipidemia   . Hypertension   . Vitamin D deficiency     Past Surgical History:  Procedure Laterality Date  . CERVICAL DISC SURGERY  12/2010   Dr. Franky Machoabbell  . MOUTH SURGERY    . [place]ONS[place]OM[place]     as teenager, [place]  . TRIGGER FINGER RELEASE Left 04/16/2013   Procedure: RELEASE TRIGGER FINGER/A-1 PULLEY LEFT THUMB;  Surgeon: Wyn Forsterobert V Sypher Jr., MD;  Location: [facility];  Service: Orthopedics;  Laterality: Left;  Marland Kitchen. VAGINAL HYSTERECTOMY  06/2000   partial, Dr. Jeanine LuzSteven Fore    There were no vitals filed for this visit.  Subjective Assessment - 02/14/18 1303    Subjective  Patient reported that she is having 6/10 pain this session. She stated she brought something she ordered online to help with her posture, but therapist stated she would like to wait on using this until after trying therapy  to improve the patient's posture.     Pertinent History  Cervical disc surgery 2012, COPD, HTN    Limitations  Standing;Walking;Sitting;Lifting;House hold activities    How long can you sit comfortably?  30 minutes    How long can you stand comfortably?  30 minutes    How long can you walk comfortably?  20 minutes    Diagnostic tests  MRI: Severe bilateral L5-S1 stenosis, L4-5 foraminal stenosis, L3-4 right foraminal annular fissure with associate stenosis    Patient Stated Goals  Strengthen her abdominals and back    Currently in Pain?  Yes    Pain Score  6     Pain Location  Back    Pain Orientation  Lower;Medial    Pain Descriptors / Indicators  Aching    Pain Type  Chronic pain    Pain Onset  More than a month ago                       [facility] - 02/14/18 0001      Posture/Postural Control   Posture/Postural Control  Postural limitations    Postural Limitations  Rounded Shoulders;Forward head;Increased thoracic kyphosis      Lumbar Exercises: Stretches   Active Hamstring Stretch  Right;Left;3 reps;30 seconds    Active Hamstring Stretch Limitations  Supine with towel    Single Knee to Chest Stretch  Right;Left;3 reps;30 seconds    Double Knee to Chest Stretch  3 reps;30 seconds      Lumbar Exercises: Supine   Ab Set  10 reps;5 seconds    Bent Knee Raise  20 reps;2 seconds;Limitations    Bent Knee Raise Limitations  Alternating legs 2 second holds    Bridge  15 reps;2 seconds             PT Education - 02/14/18 1317    Education Details  Educated patient on evaluation, goals, and HEP.     Person(s) Educated  Patient    Methods  Explanation;Demonstration;Handout    Comprehension  Verbalized understanding       PT Short Term Goals - 02/14/18 1345      PT SHORT TERM GOAL #1   Title  Patient will demonstrate understanding and report regular compliance with HEP to improve strength, mobility, and decrease pain.     Time  3     Period  Weeks    Status  On-going      PT SHORT TERM GOAL #2   Title  Patient will demonstrate an improvement of 1/2 MMT grade in all muscle groups tested as deficient to assist patient with functional mobility.     Time  3    Period  Weeks    Status  On-going      PT SHORT TERM GOAL #3   Title  Patient will report that her pain level has not exceeded a 5/10 over the course of a 1 week period, indicating better tolerance to daily activities.     Time  3    Period  Weeks    Status  On-going        PT Long Term Goals - 02/14/18 1346      PT LONG TERM GOAL #1   Title  Patient will demonstrate an improvement of 1 MMT grade in all muscle groups tested as deficient to assist patient with functional mobility.     Time  6    Period  Weeks    Status  On-going      PT LONG TERM GOAL #2   Title  Patient will report that her pain level has not exceeded a 3/10 over the course of a 1 week period, indicating better tolerance to daily activities.     Time  6    Period  Weeks    Status  On-going      PT LONG TERM GOAL #3   Title  Patient will demonstrate ability to maintain single limb stance for 30 seconds on each lower extremity indicating improved strength and stability.     Time  6    Period  Weeks    Status  On-going      PT LONG TERM GOAL #4   Title  Patient will perform the 5xSTS test in 13 seconds or less indicating improved balance and overall functional mobility.     Time  6    Period  Weeks    Status  On-going            Plan - 02/14/18 1325    Clinical Impression Statement  This session began with reviewing patient's evaluation and goals. Then progressed to stretches to patient's lumbar spine and lower extremities. Then session progressed to educating patient on abdominal sets/TA activation. Therapist educated patient on HEP and reported that patient should listen to her body and discontinue any exercises that aggravate her pain until she  returns to next treatment  session. Plan to continue with lumbar stabilization, initiate functional lower extremity strengthening and progress to postural strengthening.     Rehab Potential  Fair    Clinical Impairments Affecting Rehab Potential  Positive: Motivated,  Negative: Chronicity    PT Frequency  2x / week    PT Duration  6 weeks    PT Treatment/Interventions  ADLs/Self Care Home Management;Aquatic Therapy;Electrical Stimulation;Moist Heat;DME Instruction;Gait training;Stair training;Functional mobility training;Therapeutic activities;Therapeutic exercise;Balance training;Neuromuscular re-education;Patient/family education;Manual techniques;Passive range of motion;Dry needling;Energy conservation;Taping    PT Next Visit Plan  Continue lumbar stabilization and functional lower extremity strengthening. Initiate postural strengthening next session.     PT Home Exercise Plan  02/14/18: Ab set x 10 5'' holds; bent knee raise x 20 alternating legs; Double knee to chest 3x30 seconds; Single knee to chest 3x30 seconds; Hamstring stretch with towel 3x30 seconds all 1x/day    Consulted and Agree with Plan of Care  Patient       Patient will benefit from skilled therapeutic intervention in order to improve the following deficits and impairments:  Decreased balance, Decreased endurance, Decreased mobility, Hypomobility, Difficulty walking, Increased muscle spasms, Decreased range of motion, Improper body mechanics, Decreased activity tolerance, Decreased strength, Increased fascial restricitons, Impaired flexibility, Postural dysfunction, Pain  Visit Diagnosis: Chronic midline low back pain, with sciatica presence unspecified  Muscle weakness (generalized)  Other symptoms and signs involving the musculoskeletal system     Problem List Patient Active Problem List   Diagnosis Date Noted  . Ataxia 01/29/2016  . Chest pain 08/10/2015  . Dyspnea 08/10/2015  . Chronic bronchitis (HCC) 08/10/2015  . Tobacco abuse  08/10/2015  . Essential hypertension 08/10/2015  . Dyslipidemia 08/10/2015  . Chest pain with low risk for cardiac etiology 07/01/2015  . ABDOMINAL BLOATING 12/13/2009  . DIARRHEA 12/13/2009  . CHANGE IN BOWELS 12/13/2009  . BURSITIS, SHOULDER 06/19/2007  . HTN (hypertension) 06/19/2007   Verne CarrowMacy Sarah-Jane Nazario PT, DPT 1:47 PM, 02/14/18 [id number]  [facility] [address] [place], [place], [address] Phone: [phone]   Fax:  [phone]  Name: Natasha [name] MRN: [id number] Date of Birth: Nov 12, 1960

## 2018-02-14 NOTE — Patient Instructions (Signed)
  Transverse Abdominus Activation Abdominal set x 10 holding for 5 seconds initially. Then... Contract your lower abdominals as if you were trying to lift one leg from the table. Initiate the movement but do no lift foot greater than 1 inch from the table. Repeat opposite side. 20x alternating legs Repeat 20 Times Hold 2 Seconds Complete 1 Set Perform 1 Time(s) a Day   DOUBLE KNEE TO CHEST STRETCH - DKTC While Lying on your back, hold your knees and gently pull them up towards your chest. Repeat 3 Times Hold 30 Seconds Complete 1 Set Perform 1 Time(s) a Day   Repeat 3 Times Hold 30 Seconds Complete 1 Set Perform 1 Time(s) a Day SINGLE KNEE TO CHEST STRETCH - SKTC While Lying on your back, hold your knee and gently pull it up towards your chest.   HAMSTRING STRETCH WITH TOWEL While lying down on your back, hook a towel or strap under your foot and draw up your leg until a stretch is felt along the backside of your leg. Keep your knee in a straightened position during the stretch. Repeat 3 Times Hold 30 Seconds Complete 1 Set Perform 1 Time(s) a Day

## 2018-02-18 ENCOUNTER — Ambulatory Visit (HOSPITAL_COMMUNITY): Payer: BLUE CROSS/BLUE SHIELD | Admitting: Physical Therapy

## 2018-02-18 ENCOUNTER — Telehealth (HOSPITAL_COMMUNITY): Payer: Self-pay | Admitting: Physical Therapy

## 2018-02-18 NOTE — Telephone Encounter (Signed)
Patient canceled for today she is having some issues with her bladder

## 2018-02-21 ENCOUNTER — Encounter (HOSPITAL_COMMUNITY): Payer: Self-pay | Admitting: Physical Therapy

## 2018-02-21 ENCOUNTER — Ambulatory Visit (HOSPITAL_COMMUNITY): Payer: BLUE CROSS/BLUE SHIELD | Admitting: Physical Therapy

## 2018-02-21 DIAGNOSIS — G8929 Other chronic pain: Secondary | ICD-10-CM | POA: Diagnosis not present

## 2018-02-21 DIAGNOSIS — R29898 Other symptoms and signs involving the musculoskeletal system: Secondary | ICD-10-CM

## 2018-02-21 DIAGNOSIS — M545 Low back pain: Secondary | ICD-10-CM | POA: Diagnosis not present

## 2018-02-21 DIAGNOSIS — M6281 Muscle weakness (generalized): Secondary | ICD-10-CM | POA: Diagnosis not present

## 2018-02-21 NOTE — Therapy (Signed)
[facility] [address] [place], [place], [address] Phone: [phone]   Fax:  [phone]  Physical Therapy Treatment  Patient Details  Name: Natasha MaxinJanice M Mcloud MRN: [id number] Date of Birth: 03-04-1961 Referring Provider: Maeola HarmanStern, Joseph MD   Encounter Date: 02/21/2018  PT End of Session - 02/21/18 1127    Visit Number  3    Number of Visits  13    Date for PT Re-Evaluation  03/25/18   Mini re-assess 03/04/18   Authorization Type  BCBS other    Authorization Time Period  02/11/18 - 03/25/18    Authorization - Visit Number  3   Updated for count   Authorization - Number of Visits  30    PT Start Time  1119    PT Stop Time  1157    PT Time Calculation (min)  38 min    Activity Tolerance  Patient tolerated treatment well    Behavior During Therapy  [facility] for tasks assessed/performed       Past Medical History:  Diagnosis Date  . Arthritis   . COPD (chronic obstructive pulmonary disease) (HCC)   . DDD (degenerative disc disease)   . DDD (degenerative disc disease), lumbosacral   . Depression   . Hemorrhoids   . Hyperlipidemia   . Hypertension   . Vitamin D deficiency     Past Surgical History:  Procedure Laterality Date  . CERVICAL DISC SURGERY  12/2010   Dr. Franky Machoabbell  . MOUTH SURGERY    . [place]ONSIL[place]OM[place]     [place] teenager[place]  . TRIGGER FINGER RELEASE Left 04/16/2013   Procedure: RELEASE TRIGGER FINGER/A-1 PULLEY LEFT THUMB;  Surgeon: Wyn Forsterobert V Sypher Jr., MD;  Location: [facility];  Service: Orthopedics;  Laterality: Left;  Marland Kitchen. VAGINAL HYSTERECTOMY  06/2000   partial, Dr. Jeanine LuzSteven Fore    There were no vitals filed for this visit.  Subjective Assessment - 02/21/18 1120    Subjective  Patient reported she had a UTI. She stated she is feeling better. She stated she has done her exercises at home.     Pertinent History  Cervical disc surgery 2012, COPD, HTN    Limitations  Standing;Walking;Sitting;Lifting;House  hold activities    How long can you sit comfortably?  30 minutes    How long can you stand comfortably?  30 minutes    How long can you walk comfortably?  20 minutes    Diagnostic tests  MRI: Severe bilateral L5-S1 stenosis, L4-5 foraminal stenosis, L3-4 right foraminal annular fissure with associate stenosis    Patient Stated Goals  Strengthen her abdominals and back    Currently in Pain?  Yes    Pain Score  5     Pain Location  Back    Pain Orientation  Lower;Medial    Pain Descriptors / Indicators  Aching    Pain Type  Chronic pain    Pain Onset  More than a month ago                       [facility] Treatment/Exercise - 02/21/18 0001      Posture/Postural Control   Posture/Postural Control  Postural limitations    Postural Limitations  Rounded Shoulders;Forward head;Increased thoracic kyphosis      Exercises   Exercises  Lumbar      Lumbar Exercises: Stretches   Active Hamstring Stretch  Right;Left;3 reps;30 seconds    Active  Hamstring Stretch Limitations  Supine with sheet    Single Knee to Chest Stretch  Right;Left;3 reps;30 seconds    Double Knee to Chest Stretch  3 reps;30 seconds      Lumbar Exercises: Standing   Functional Squats  15 reps    Functional Squats Limitations  With demonstration and verbal cues for form    Forward Lunge  15 reps    Forward Lunge Limitations  Onto 4'' step with verbal cues and demonstration    Scapular Retraction  Strengthening;Both;20 reps    Theraband Level (Scapular Retraction)  Level 2 (Red)    Scapular Retraction Limitations  2x10     Row  Strengthening;Both;Theraband;20 reps    Theraband Level (Row)  Level 2 (Red)    Row Limitations  2x10    Shoulder Extension  Strengthening;Both;20 reps    Theraband Level (Shoulder Extension)  Level 2 (Red)    Shoulder Extension Limitations  2x10    Other Standing Lumbar Exercises  Heel taps on 4'' step x 10 each lower extremity with cues to engage abdominals      Lumbar  Exercises: Supine   Ab Set  10 reps;5 seconds    Bent Knee Raise  20 reps;2 seconds;Limitations    Bent Knee Raise Limitations  Alternating legs 2 second holds    Bridge  15 reps;2 seconds             PT Education - 02/21/18 1122    Education Details  Discussed purpose and technique of exercises throughout session.     Person(s) Educated  Patient    Methods  Explanation;Verbal cues;Tactile cues    Comprehension  Verbalized understanding       PT Short Term Goals - 02/14/18 1345      PT SHORT TERM GOAL #1   Title  Patient will demonstrate understanding and report regular compliance with HEP to improve strength, mobility, and decrease pain.     Time  3    Period  Weeks    Status  On-going      PT SHORT TERM GOAL #2   Title  Patient will demonstrate an improvement of 1/2 MMT grade in all muscle groups tested as deficient to assist patient with functional mobility.     Time  3    Period  Weeks    Status  On-going      PT SHORT TERM GOAL #3   Title  Patient will report that her pain level has not exceeded a 5/10 over the course of a 1 week period, indicating better tolerance to daily activities.     Time  3    Period  Weeks    Status  On-going        PT Long Term Goals - 02/14/18 1346      PT LONG TERM GOAL #1   Title  Patient will demonstrate an improvement of 1 MMT grade in all muscle groups tested as deficient to assist patient with functional mobility.     Time  6    Period  Weeks    Status  On-going      PT LONG TERM GOAL #2   Title  Patient will report that her pain level has not exceeded a 3/10 over the course of a 1 week period, indicating better tolerance to daily activities.     Time  6    Period  Weeks    Status  On-going      PT LONG TERM  GOAL #3   Title  Patient will demonstrate ability to maintain single limb stance for 30 seconds on each lower extremity indicating improved strength and stability.     Time  6    Period  Weeks    Status  On-going       PT LONG TERM GOAL #4   Title  Patient will perform the 5xSTS test in 13 seconds or less indicating improved balance and overall functional mobility.     Time  6    Period  Weeks    Status  On-going            Plan - 02/21/18 1133    Clinical Impression Statement  This session continued with established plan of care. Continued with a review of patient's HEP which patient continued to require verbal cues and tactile cues to perform. This session added standing postural strengthening to improve patient's postural awareness. Also added functional strengthening exercises including forward lunges, heel taps, and functional squats. Patient tolerated all exercises well this session. Plan to continue with exercises to improve patient's mobility and flexibility, and stabilization exercises.     Rehab Potential  Fair    Clinical Impairments Affecting Rehab Potential  Positive: Motivated,  Negative: Chronicity    PT Frequency  2x / week    PT Duration  6 weeks    PT Treatment/Interventions  ADLs/Self Care Home Management;Aquatic Therapy;Electrical Stimulation;Moist Heat;DME Instruction;Gait training;Stair training;Functional mobility training;Therapeutic activities;Therapeutic exercise;Balance training;Neuromuscular re-education;Patient/family education;Manual techniques;Passive range of motion;Dry needling;Energy conservation;Taping    PT Next Visit Plan  Continue lumbar stabilization and functional lower extremity strengthening. Continue postural strengthening. Progress lumbar stabilization and progress functional LE strengthening.     PT Home Exercise Plan  02/14/18: Ab set x 10 5'' holds; bent knee raise x 20 alternating legs; Double knee to chest 3x30 seconds; Single knee to chest 3x30 seconds; Hamstring stretch with towel 3x30 seconds all 1x/day    Consulted and Agree with Plan of Care  Patient       Patient will benefit from skilled therapeutic intervention in order to improve the  following deficits and impairments:  Decreased balance, Decreased endurance, Decreased mobility, Hypomobility, Difficulty walking, Increased muscle spasms, Decreased range of motion, Improper body mechanics, Decreased activity tolerance, Decreased strength, Increased fascial restricitons, Impaired flexibility, Postural dysfunction, Pain  Visit Diagnosis: Chronic midline low back pain, with sciatica presence unspecified  Muscle weakness (generalized)  Other symptoms and signs involving the musculoskeletal system     Problem List Patient Active Problem List   Diagnosis Date Noted  . Ataxia 01/29/2016  . Chest pain 08/10/2015  . Dyspnea 08/10/2015  . Chronic bronchitis (HCC) 08/10/2015  . Tobacco abuse 08/10/2015  . Essential hypertension 08/10/2015  . Dyslipidemia 08/10/2015  . Chest pain with low risk for cardiac etiology 07/01/2015  . ABDOMINAL BLOATING 12/13/2009  . DIARRHEA 12/13/2009  . CHANGE IN BOWELS 12/13/2009  . BURSITIS, SHOULDER 06/19/2007  . HTN (hypertension) 06/19/2007   Verne Carrow PT, DPT 12:02 PM, 02/21/18 [phone]  [facility] [address] [place], [place], [address] Phone: [phone]   Fax:  [phone]  Name: BRYLIN STOPPER MRN: [id number] Date of Birth: August 18, 1960

## 2018-02-25 ENCOUNTER — Encounter (HOSPITAL_COMMUNITY): Payer: Self-pay | Admitting: Physical Therapy

## 2018-02-25 ENCOUNTER — Ambulatory Visit (HOSPITAL_COMMUNITY): Payer: BLUE CROSS/BLUE SHIELD | Admitting: Physical Therapy

## 2018-02-25 DIAGNOSIS — G8929 Other chronic pain: Secondary | ICD-10-CM

## 2018-02-25 DIAGNOSIS — M6281 Muscle weakness (generalized): Secondary | ICD-10-CM

## 2018-02-25 DIAGNOSIS — M545 Low back pain: Secondary | ICD-10-CM | POA: Diagnosis not present

## 2018-02-25 DIAGNOSIS — R29898 Other symptoms and signs involving the musculoskeletal system: Secondary | ICD-10-CM

## 2018-02-25 NOTE — Therapy (Signed)
[facility] [address] [place], [place], [address] Phone: [phone]   Fax:  [phone]  Physical Therapy Treatment  Patient Details  Name: Natasha [name] MRN: [id number] Date of Birth: October 18, 1960 Referring Provider: Maeola Harman MD   Encounter Date: 02/25/2018  PT End of Session - 02/25/18 1120    Visit Number  4    Number of Visits  13    Date for PT Re-Evaluation  03/25/18   Mini re-assess 03/04/18   Authorization Type  BC[employer] other    Authorization Time Period  02/11/18 - 03/25/18    Authorization - Visit Number  4   Updated for count   Authorization - Number of Visits  30    PT Start Time  1117    PT Stop Time  1155    PT Time Calculation (min)  38 min    Activity Tolerance  Patient tolerated treatment well    Behavior During Therapy  [facility] for tasks assessed/performed       Past Medical History:  Diagnosis Date  . Arthritis   . COPD (chronic obstructive pulmonary disease) (HCC)   . DDD (degenerative disc disease)   . DDD (degenerative disc disease), lumbosacral   . Depression   . Hemorrhoids   . Hyperlipidemia   . Hypertension   . Vitamin D deficiency     Past Surgical History:  Procedure Laterality Date  . CERVICAL DISC SURGERY  12/2010   Dr. Franky Macho  . MOUTH SURGERY    . TONSILLECTOMY     as teenager, in [place]  . TRIGGER FINGER RELEASE Left 04/16/2013   Procedure: RELEASE TRIGGER FINGER/A-1 PULLEY LEFT THUMB;  Surgeon: Wyn Forster., MD;  Location:  [facility];  Service: Orthopedics;  Laterality: Left;  Marland Kitchen VAGINAL HYSTERECTOMY  06/2000   partial, Dr. Jeanine Luz    There were no vitals filed for this visit.  Subjective Assessment - 02/25/18 1118    Subjective  Patient reported that her back pain is a 5/10 this session.     Pertinent History  Cervical disc surgery 2012, COPD, HTN    Limitations  Standing;Walking;Sitting;Lifting;House hold activities    How long can you sit comfortably?   30 minutes    How long can you stand comfortably?  30 minutes    How long can you walk comfortably?  20 minutes    Diagnostic tests  MRI: Severe bilateral L5-S1 stenosis, L4-5 foraminal stenosis, L3-4 right foraminal annular fissure with associate stenosis    Patient Stated Goals  Strengthen her abdominals and back    Currently in Pain?  Yes    Pain Score  5     Pain Location  Back    Pain Orientation  Lower;Medial    Pain Descriptors / Indicators  Aching    Pain Type  Chronic pain    Pain Onset  More than a month ago                       [facility] Adult PT Treatment/Exercise - 02/25/18 0001      Posture/Postural Control   Posture/Postural Control  Postural limitations    Postural Limitations  Rounded Shoulders;Forward head;Increased thoracic kyphosis      Lumbar Exercises: Stretches   Active Hamstring Stretch  Right;Left;3 reps;30 seconds    Active Hamstring Stretch Limitations  Supine with sheet    Single Knee to Chest Stretch  Right;Left;3 reps;30 seconds  Double Knee to Chest Stretch  3 reps;30 seconds      Lumbar Exercises: Standing   Functional Squats  15 reps    Functional Squats Limitations  With demonstration and verbal cues for form    Forward Lunge  15 reps    Forward Lunge Limitations  Onto 4'' step with verbal cues and demonstration    Scapular Retraction  Strengthening;Both;20 reps    Theraband Level (Scapular Retraction)  Level 2 (Red)    Scapular Retraction Limitations  2x10     Row  Strengthening;Both;Theraband;20 reps    Theraband Level (Row)  Level 2 (Red)    Row Limitations  2x10     Shoulder Extension  Strengthening;Both;20 reps    Theraband Level (Shoulder Extension)  Level 2 (Red)    Shoulder Extension Limitations  2x10    Other Standing Lumbar Exercises  Palov press with red theraband 20x with each lower extremity forward. Heel taps on 4'' step x 15 each lower extremity with cues to engage abdominals      Lumbar Exercises: Supine   Ab  Set  10 reps;5 seconds    Bent Knee Raise  20 reps;2 seconds;Limitations    Bent Knee Raise Limitations  Alternating legs 2 second holds    Bridge  15 reps;2 seconds      Lumbar Exercises: Sidelying   Clam  Right;Left;10 reps;Limitations    Clam Limitations  Red theraband             PT Education - 02/25/18 1120    Education Details  Discussed purpose and technique of exercises throughout session.     Person(s) Educated  Patient    Methods  Explanation;Verbal cues    Comprehension  Verbalized understanding       PT Short Term Goals - 02/14/18 1345      PT SHORT TERM GOAL #1   Title  Patient will demonstrate understanding and report regular compliance with HEP to improve strength, mobility, and decrease pain.     Time  3    Period  Weeks    Status  On-going      PT SHORT TERM GOAL #2   Title  Patient will demonstrate an improvement of 1/2 MMT grade in all muscle groups tested as deficient to assist patient with functional mobility.     Time  3    Period  Weeks    Status  On-going      PT SHORT TERM GOAL #3   Title  Patient will report that her pain level has not exceeded a 5/10 over the course of a 1 week period, indicating better tolerance to daily activities.     Time  3    Period  Weeks    Status  On-going        PT Long Term Goals - 02/14/18 1346      PT LONG TERM GOAL #1   Title  Patient will demonstrate an improvement of 1 MMT grade in all muscle groups tested as deficient to assist patient with functional mobility.     Time  6    Period  Weeks    Status  On-going      PT LONG TERM GOAL #2   Title  Patient will report that her pain level has not exceeded a 3/10 over the course of a 1 week period, indicating better tolerance to daily activities.     Time  6    Period  Weeks  Status  On-going      PT LONG TERM GOAL #3   Title  Patient will demonstrate ability to maintain single limb stance for 30 seconds on each lower extremity indicating improved  strength and stability.     Time  6    Period  Weeks    Status  On-going      PT LONG TERM GOAL #4   Title  Patient will perform the 5xSTS test in 13 seconds or less indicating improved balance and overall functional mobility.     Time  6    Period  Weeks    Status  On-going            Plan - 02/25/18 1200    Clinical Impression Statement  This session continued with established plan of care. This session continued with progressing patient with abdominal and lower extremity strengthening exercises. Patient reported only minimal soreness following last session and therefore was able to progress patient this session. This session added sidelying clams with red theraband and palov press  Plan to continue with exercises to improve patient's abdominal strength, postural strengthening, and functional lower extremity strengthening.     Rehab Potential  Fair    Clinical Impairments Affecting Rehab Potential  Positive: Motivated,  Negative: Chronicity    PT Frequency  2x / week    PT Duration  6 weeks    PT Treatment/Interventions  ADLs/Self Care Home Management;Aquatic Therapy;Electrical Stimulation;Moist Heat;DME Instruction;Gait training;Stair training;Functional mobility training;Therapeutic activities;Therapeutic exercise;Balance training;Neuromuscular re-education;Patient/family education;Manual techniques;Passive range of motion;Dry needling;Energy conservation;Taping    PT Next Visit Plan  Next session add postural strengthening to HEP if demonstrating good form. Add dead bug and progress to balance exercises. Continue lumbar stabilization and functional lower extremity strengthening. Continue postural strengthening. Progress lumbar stabilization and progress functional LE strengthening.     PT Home Exercise Plan  02/14/18: Ab set x 10 5'' holds; bent knee raise x 20 alternating legs; Double knee to chest 3x30 seconds; Single knee to chest 3x30 seconds; Hamstring stretch with towel 3x30  seconds all 1x/day    Consulted and Agree with Plan of Care  Patient       Patient will benefit from skilled therapeutic intervention in order to improve the following deficits and impairments:  Decreased balance, Decreased endurance, Decreased mobility, Hypomobility, Difficulty walking, Increased muscle spasms, Decreased range of motion, Improper body mechanics, Decreased activity tolerance, Decreased strength, Increased fascial restricitons, Impaired flexibility, Postural dysfunction, Pain  Visit Diagnosis: Chronic midline low back pain, with sciatica presence unspecified  Muscle weakness (generalized)  Other symptoms and signs involving the musculoskeletal system     Problem List Patient Active Problem List   Diagnosis Date Noted  . Ataxia 01/29/2016  . Chest pain 08/10/2015  . Dyspnea 08/10/2015  . Chronic bronchitis (HCC) 08/10/2015  . Tobacco abuse 08/10/2015  . Essential hypertension 08/10/2015  . Dyslipidemia 08/10/2015  . Chest pain with low risk for cardiac etiology 07/01/2015  . ABDOMINAL BLOATING 12/13/2009  . DIARRHEA 12/13/2009  . CHANGE IN BOWELS 12/13/2009  . BURSITIS, SHOULDER 06/19/2007  . HTN (hypertension) 06/19/2007   Verne CarrowMacy Teffany Blaszczyk PT, DPT 12:04 PM, 02/25/18 [phone]  [facility] [address] [place], [place], [address] Phone: [phone]   Fax:  [phone]  Name: Natasha [name] MRN: [id number] Date of Birth: [birth year]

## 2018-02-28 ENCOUNTER — Ambulatory Visit (HOSPITAL_COMMUNITY): Payer: BLUE CROSS/BLUE SHIELD | Admitting: Physical Therapy

## 2018-02-28 ENCOUNTER — Encounter (HOSPITAL_COMMUNITY): Payer: Self-pay | Admitting: Physical Therapy

## 2018-02-28 DIAGNOSIS — G8929 Other chronic pain: Secondary | ICD-10-CM

## 2018-02-28 DIAGNOSIS — M6281 Muscle weakness (generalized): Secondary | ICD-10-CM | POA: Diagnosis not present

## 2018-02-28 DIAGNOSIS — R29898 Other symptoms and signs involving the musculoskeletal system: Secondary | ICD-10-CM | POA: Diagnosis not present

## 2018-02-28 DIAGNOSIS — M545 Low back pain: Principal | ICD-10-CM

## 2018-02-28 NOTE — Therapy (Addendum)
[facility] [address] [place], [place], [address] Phone: [phone]   Fax:  [phone]  Physical Therapy Treatment  Patient Details  Name: Natasha [name] MRN: [id number] Date of Birth: Feb 19, 1961 Referring Provider: Maeola Harman MD   Encounter Date: 02/28/2018  PT End of Session - 02/28/18 1121    Visit Number  5    Number of Visits  13    Date for PT Re-Evaluation  03/25/18   Mini re-assess 03/04/18   Authorization Type  [employer] other    Authorization Time Period  02/11/18 - 03/25/18    Authorization - Visit Number  5   Updated for count   Authorization - Number of Visits  30    PT Start Time  1118    PT Stop Time  1157    PT Time Calculation (min)  39 min    Activity Tolerance  Patient tolerated treatment well    Behavior During Therapy  [facility] for tasks assessed/performed       Past Medical History:  Diagnosis Date  . Arthritis   . COPD (chronic obstructive pulmonary disease) (HCC)   . DDD (degenerative disc disease)   . DDD (degenerative disc disease), lumbosacral   . Depression   . Hemorrhoids   . Hyperlipidemia   . Hypertension   . Vitamin D deficiency     Past Surgical History:  Procedure Laterality Date  . CERVICAL DISC SURGERY  12/2010   Dr. Franky Macho  . MOUTH SURGERY    . TONSILLECTOMY     as teenager, in [place]  . TRIGGER FINGER RELEASE Left 04/16/2013   Procedure: RELEASE TRIGGER FINGER/A-1 PULLEY LEFT THUMB;  Surgeon: Wyn Forster., MD;  Location: [facility];  Service: Orthopedics;  Laterality: Left;  Marland Kitchen VAGINAL HYSTERECTOMY  06/2000   partial, Dr. Jeanine Luz    There were no vitals filed for this visit.  Subjective Assessment - 02/28/18 1120    Subjective  Patient reported that she is having 6/10 pain this session which she stated may be due to the fact that she started doing 20 minutes of cyclinc on her stationary bike.     Pertinent History  Cervical disc surgery 2012, COPD, HTN    Limitations  Standing;Walking;Sitting;Lifting;House hold activities    How long can you sit comfortably?  30 minutes    How long can you stand comfortably?  30 minutes    How long can you walk comfortably?  20 minutes    Diagnostic tests  MRI: Severe bilateral L5-S1 stenosis, L4-5 foraminal stenosis, L3-4 right foraminal annular fissure with associate stenosis    Patient Stated Goals  Strengthen her abdominals and back    Currently in Pain?  Yes    Pain Score  6     Pain Location  Back    Pain Orientation  Lower;Medial    Pain Descriptors / Indicators  Aching    Pain Type  Chronic pain    Pain Onset  More than a month ago                       [facility] Adult PT Treatment/Exercise - 02/28/18 0001      Lumbar Exercises: Stretches   Single Knee to Chest Stretch  Right;Left;3 reps;30 seconds      Lumbar Exercises: Standing   Functional Squats  15 reps    Functional Squats Limitations  With demonstration and verbal cues for form  Forward Lunge  15 reps    Forward Lunge Limitations  Onto 4'' step with verbal cues and demonstration    Scapular Retraction  Strengthening;Both;20 reps    Theraband Level (Scapular Retraction)  Level 2 (Red)    Scapular Retraction Limitations  2x10     Row  Strengthening;Both;Theraband;20 reps    Theraband Level (Row)  Level 2 (Red)    Row Limitations  2x10    Shoulder Extension  Strengthening;Both;20 reps    Theraband Level (Shoulder Extension)  Level 2 (Red)    Shoulder Extension Limitations  2x10    Other Standing Lumbar Exercises  Sidestepping 14 feet x 2 roundtrips with RTB. SLS vectors forward, side, back 5'' x 5 reps each. Palov press tandem stance with red theraband 20x with each lower extremity forward. Heel taps on 4'' step x 15 each lower extremity with cues to engage abdominals      Lumbar Exercises: Supine   Bridge  15 reps;2 seconds      Lumbar Exercises: Sidelying   Clam  Right;Left;10 reps;Limitations    Clam Limitations   Red theraband     Dead bug x 10 each upper/lower extremity        PT Education - 02/28/18 1121    Education Details  Updated HEP.     Person(s) Educated  Patient    Methods  Explanation;Handout    Comprehension  Verbalized understanding       PT Short Term Goals - 02/14/18 1345      PT SHORT TERM GOAL #1   Title  Patient will demonstrate understanding and report regular compliance with HEP to improve strength, mobility, and decrease pain.     Time  3    Period  Weeks    Status  On-going      PT SHORT TERM GOAL #2   Title  Patient will demonstrate an improvement of 1/2 MMT grade in all muscle groups tested as deficient to assist patient with functional mobility.     Time  3    Period  Weeks    Status  On-going      PT SHORT TERM GOAL #3   Title  Patient will report that her pain level has not exceeded a 5/10 over the course of a 1 week period, indicating better tolerance to daily activities.     Time  3    Period  Weeks    Status  On-going        PT Long Term Goals - 02/14/18 1346      PT LONG TERM GOAL #1   Title  Patient will demonstrate an improvement of 1 MMT grade in all muscle groups tested as deficient to assist patient with functional mobility.     Time  6    Period  Weeks    Status  On-going      PT LONG TERM GOAL #2   Title  Patient will report that her pain level has not exceeded a 3/10 over the course of a 1 week period, indicating better tolerance to daily activities.     Time  6    Period  Weeks    Status  On-going      PT LONG TERM GOAL #3   Title  Patient will demonstrate ability to maintain single limb stance for 30 seconds on each lower extremity indicating improved strength and stability.     Time  6    Period  Weeks    Status  On-going      PT LONG TERM GOAL #4   Title  Patient will perform the 5xSTS test in 13 seconds or less indicating improved balance and overall functional mobility.     Time  6    Period  Weeks    Status   On-going            Plan - 02/28/18 1135    Clinical Impression Statement  This session continued to progress patient with established plan of care. This session added postural strengthening exercises to patient's HEP as patient demonstrated good form with exercises this session. This session added dead bug exercise to progress patient with lumbar stabilization exercises and also added sidestepping with red theraband this session to improve patient's hip stability. Patient required verbal cues and tactile cues for proper form with this. Plan to continue with established plan of care and add manual therapy as needed next session.     Rehab Potential  Fair    Clinical Impairments Affecting Rehab Potential  Positive: Motivated,  Negative: Chronicity    PT Frequency  2x / week    PT Duration  6 weeks    PT Treatment/Interventions  ADLs/Self Care Home Management;Aquatic Therapy;Electrical Stimulation;Moist Heat;DME Instruction;Gait training;Stair training;Functional mobility training;Therapeutic activities;Therapeutic exercise;Balance training;Neuromuscular re-education;Patient/family education;Manual techniques;Passive range of motion;Dry needling;Energy conservation;Taping    PT Next Visit Plan  Continue lumbar stabilization and functional lower extremity strengthening, progress to seated on physioball marching next session. Manual therapy as needed for pain control.     PT Home Exercise Plan  02/14/18: Ab set x 10 5'' holds; bent knee raise x 20 alternating legs; Double knee to chest 3x30 seconds; Single knee to chest 3x30 seconds; Hamstring stretch with towel 3x30 seconds all 1x/day; 02/28/18: Postural strengthening rows, shoulder extension, scapular retraction with RTB 2x10 1x/day    Consulted and Agree with Plan of Care  Patient       Patient will benefit from skilled therapeutic intervention in order to improve the following deficits and impairments:  Decreased balance, Decreased endurance,  Decreased mobility, Hypomobility, Difficulty walking, Increased muscle spasms, Decreased range of motion, Improper body mechanics, Decreased activity tolerance, Decreased strength, Increased fascial restricitons, Impaired flexibility, Postural dysfunction, Pain  Visit Diagnosis: Chronic midline low back pain, with sciatica presence unspecified  Muscle weakness (generalized)  Other symptoms and signs involving the musculoskeletal system     Problem List Patient Active Problem List   Diagnosis Date Noted  . Ataxia 01/29/2016  . Chest pain 08/10/2015  . Dyspnea 08/10/2015  . Chronic bronchitis (HCC) 08/10/2015  . Tobacco abuse 08/10/2015  . Essential hypertension 08/10/2015  . Dyslipidemia 08/10/2015  . Chest pain with low risk for cardiac etiology 07/01/2015  . ABDOMINAL BLOATING 12/13/2009  . DIARRHEA 12/13/2009  . CHANGE IN BOWELS 12/13/2009  . BURSITIS, SHOULDER 06/19/2007  . HTN (hypertension) 06/19/2007   Verne CarrowMacy Zana Biancardi PT, DPT 12:03 PM, 02/28/18 [phone]  [facility] [address] [place], [place], [address] Phone: [phone]   Fax:  [phone]  Name: Duwaine MaxinJanice M Hallgren MRN: [id number] Date of Birth: 1960/11/14

## 2018-02-28 NOTE — Patient Instructions (Signed)
  Scapular Retraction Wrap an elastic band around a door knob or banister. Grab the ends of the band with both hands with your arms extended. With good posture, pull the band backwards and squeeze your shoulder blades together for 3 seconds. Make sure your elbows stay close to your body. Repeat 10 Times Complete 2 Sets Perform 1 Time(s) a Day   ELASTIC BAND EXTENSION BILATERAL SHOULDER While holding an elastic band with both arms in front of you with your elbows straight, pull the band downwards and back towards your side.  Repeat 10 Times Hold 1 Second Complete 2 Sets Perform 1 Time(s) a Day   ELASTIC BAND ROWS Holding elastic band with both hands, draw back the band as you bend your elbows. Keep your elbows near the side of your Body. Repeat 10 Times Hold 1 Second Complete 2 Sets Perform 1 Time(s) a Day

## 2018-03-04 ENCOUNTER — Telehealth (HOSPITAL_COMMUNITY): Payer: Self-pay | Admitting: Physical Therapy

## 2018-03-04 ENCOUNTER — Ambulatory Visit (HOSPITAL_COMMUNITY): Payer: BLUE CROSS/BLUE SHIELD | Admitting: Physical Therapy

## 2018-03-04 NOTE — Telephone Encounter (Signed)
She did alot yesterday and is having alot of pain today. I asked her if she wanted to come in so her PT could check her out and she decline
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
## 2018-03-07 ENCOUNTER — Encounter (HOSPITAL_COMMUNITY): Payer: Self-pay | Admitting: Physical Therapy

## 2018-03-07 ENCOUNTER — Ambulatory Visit (HOSPITAL_COMMUNITY): Payer: BLUE CROSS/BLUE SHIELD | Attending: Neurosurgery | Admitting: Physical Therapy

## 2018-03-07 DIAGNOSIS — M6281 Muscle weakness (generalized): Secondary | ICD-10-CM | POA: Diagnosis not present

## 2018-03-07 DIAGNOSIS — R29898 Other symptoms and signs involving the musculoskeletal system: Secondary | ICD-10-CM | POA: Insufficient documentation

## 2018-03-07 DIAGNOSIS — M545 Low back pain: Secondary | ICD-10-CM | POA: Diagnosis not present

## 2018-03-07 DIAGNOSIS — G8929 Other chronic pain: Secondary | ICD-10-CM

## 2018-03-07 NOTE — Therapy (Signed)
[facility] [address] [place], [place], [address] Phone: [phone]   Fax:  [phone]  Physical Therapy Treatment [facility] note Patient Details  Name: Natasha [name] MRN: [id number] Date of Birth: 1961/03/23 Referring Provider: Erline Levine MD   Encounter Date: 03/07/2018   Progress Note Reporting Period 02/11/18 to 03/07/18  See note below for Objective Data and Assessment of Progress/Goals.       PT End of Session - 03/07/18 1125    Visit Number  6    Number of Visits  13    Date for PT Re-Evaluation  03/25/18   Mini re-assess 03/04/18   Authorization Type  [employer] other    Authorization Time Period  02/11/18 - 03/25/18    Authorization - Visit Number  6   Updated for count   Authorization - Number of Visits  30    PT Start Time  4010   Patient arrived late   PT Stop Time  1200   Some time unbilled for re-assessment   PT Time Calculation (min)  36 min    Activity Tolerance  Patient tolerated treatment well    Behavior During Therapy  WFL for tasks assessed/performed       Past Medical History:  Diagnosis Date  . Arthritis   . COPD (chronic obstructive pulmonary disease) ([place])   . DDD (degenerative disc disease)   . DDD (degenerative disc disease), lumbosacral   . Depression   . Hemorrhoids   . Hyperlipidemia   . Hypertension   . Vitamin D deficiency     Past Surgical History:  Procedure Laterality Date  . CERVICAL DISC SURGERY  12/2010   Dr. Christella Noa  . MOUTH SURGERY    . TONSILLECTOMY     as teenager, in [place] Left 04/16/2013   Procedure: RELEASE TRIGGER FINGER/A-1 PULLEY LEFT THUMB;  Surgeon: Cammie Sickle., MD;  Location: [facility];  Service: Orthopedics;  Laterality: Left;  [facility] Kitchen VAGINAL HYSTERECTOMY  06/2000   partial, Dr. Serafina Royals    There were no vitals filed for this visit.  Subjective Assessment - 03/07/18 1124    Subjective  Patient stated that she  did a lot at home getting ready for her garage sale and her back is really bothering her today.     Pertinent History  Cervical disc surgery 2012, COPD, HTN    Limitations  Standing;Walking;Sitting;Lifting;House hold activities    How long can you sit comfortably?  30 minutes    How long can you stand comfortably?  30 minutes    How long can you walk comfortably?  20 minutes    Diagnostic tests  MRI: Severe bilateral L5-S1 stenosis, L4-5 foraminal stenosis, L3-4 right foraminal annular fissure with associate stenosis    Patient Stated Goals  Strengthen her abdominals and back    Currently in Pain?  Yes    Pain Score  8     Pain Location  Back    Pain Orientation  Lower;Medial    Pain Descriptors / Indicators  Aching    Pain Type  Chronic pain    Pain Onset  More than a month ago         [facility] PT Assessment - 03/07/18 0001      Assessment   Medical Diagnosis  Radiculopathy, Lumbar Region    Referring Provider  Erline Levine MD    Onset Date/Surgical Date  --   Many  Years   Prior Therapy  Yes, a long time ago      Observation/Other Assessments   Focus on Therapeutic Outcomes (FOTO)   47% (53% limited)      Posture/Postural Control   Posture/Postural Control  Postural limitations    Postural Limitations  Rounded Shoulders;Forward head;Increased thoracic kyphosis      AROM   Lumbar Flexion  75% limited   was 50%; painful   Lumbar Extension  25% limited    Lumbar - Right Side Bend  25% limited   painful   Lumbar - Left Side Bend  25% limited   painful   Lumbar - Right Rotation  WNL    Lumbar - Left Rotation  WNL      Strength   Right Hip Flexion  4/5   was 4/5   Right Hip Extension  4/5   was 4/5   Right Hip ABduction  4+/5   was 4/5   Left Hip Flexion  4+/5   was 4/5   Left Hip Extension  4/5   was 4/5   Left Hip ABduction  4+/5   was 4/5   Right Knee Flexion  5/5   was 4+/5   Right Knee Extension  5/5   was 4+/5   Left Knee Flexion  5/5   was 4+/5   Left  Knee Extension  5/5   was 4+/5   Right Ankle Dorsiflexion  5/5   was 4+/5   Left Ankle Dorsiflexion  5/5   was 4+/5     Flexibility   Hamstrings  25% limited bilaterally      Palpation   Palpation comment   noted increased muscular restrictions through bilateral lumbar paraspinals      Static Standing Balance   Static Standing - Balance Support  No upper extremity supported    Static Standing Balance -  Activities   Single Leg Stance - Right Leg;Single Leg Stance - Left Leg    Static Standing - Comment/# of Minutes  > 1 minute each lower extremity      Standardized Balance Assessment   Five times sit to stand comments   21.74 seconds from standard height chair                   [facility] Adult PT Treatment/Exercise - 03/07/18 0001      Manual Therapy   Manual Therapy  Soft tissue mobilization    Manual therapy comments  Completed separately from all other skilled interventions    Soft tissue mobilization  Patient prone with bolster under ankles. Soft tissue mobilization to patient's bilateral lumbar paraspinals to decrease muscular restrictions and pain.              PT Education - 03/07/18 1202    Education Details  Discussed benefit of soft tissue mobilization and purpose in conjunction with exercises as well as re-assessment findings.     Person(s) Educated  Patient    Methods  Explanation    Comprehension  Verbalized understanding       PT Short Term Goals - 03/07/18 1208      PT SHORT TERM GOAL #1   Title  Patient will demonstrate understanding and report regular compliance with HEP to improve strength, mobility, and decrease pain.     Baseline  03/07/18: Patient stated she does her exercises regularly except the past couple days.     Time  3    Period  Weeks    Status  Achieved      PT SHORT TERM GOAL #2   Title  Patient will demonstrate an improvement of 1/2 MMT grade in all muscle groups tested as deficient to assist patient with functional  mobility.     Baseline  03/07/18: Patient improved by 1/2 MMT grade in some, but not all muscle groups.     Time  3    Period  Weeks    Status  Partially Met      PT SHORT TERM GOAL #3   Title  Patient will report that her pain level has not exceeded a 5/10 over the course of a 1 week period, indicating better tolerance to daily activities.     Baseline  03/07/18: Patient reported an 8/10 pain this session.     Time  3    Period  Weeks    Status  On-going        PT Long Term Goals - 03/07/18 1210      PT LONG TERM GOAL #1   Title  Patient will demonstrate an improvement of 1 MMT grade in all muscle groups tested as deficient to assist patient with functional mobility.     Baseline  03/07/18: See MMT.     Time  6    Period  Weeks    Status  On-going      PT LONG TERM GOAL #2   Title  Patient will report that her pain level has not exceeded a 3/10 over the course of a 1 week period, indicating better tolerance to daily activities.     Baseline  03/07/18: Patient stated her pain has been an 8/10 today.     Time  6    Period  Weeks    Status  On-going      PT LONG TERM GOAL #3   Title  Patient will demonstrate ability to maintain single limb stance for 30 seconds on each lower extremity indicating improved strength and stability.     Baseline  03/07/18: Patient demonstrated single limb stance of > 1  minute.     Time  6    Period  Weeks    Status  Achieved      PT LONG TERM GOAL #4   Title  Patient will perform the 5xSTS test in 13 seconds or less indicating improved balance and overall functional mobility.     Baseline  03/07/18: See objective measures.     Time  6    Period  Weeks    Status  On-going            Plan - 03/07/18 1214    Clinical Impression Statement  This session patient reported a high level of pain due to moving boxes at home in preparation for a garage sale and therefore the re-assessment may have been limited by this. Patient achieved 1/3 short term goals  and partially met 1/3 short term goals. Patient met  long term goals. Patient has demonstrate improvements in stability with single limb stance and has reported compliance with HEP. Patient had demonstrated some improvements in strength, but continues to be limited particularly in hip musculature. Remainder of session performed soft tissue mobilization in order to decrease muscular restrictions and decrease pain. Patient reported a decrease in pain to 7/10 at end of session. Patient would benefit from continued skilled physical therapy in order to continue progressing towards functional goals.     Rehab Potential  Fair    Clinical Impairments Affecting Rehab  Potential  Positive: Motivated,  Negative: Chronicity    PT Frequency  2x / week    PT Duration  6 weeks    PT Treatment/Interventions  ADLs/Self Care Home Management;Aquatic Therapy;Electrical Stimulation;Moist Heat;DME Instruction;Gait training;Stair training;Functional mobility training;Therapeutic activities;Therapeutic exercise;Balance training;Neuromuscular re-education;Patient/family education;Manual techniques;Passive range of motion;Dry needling;Energy conservation;Taping    PT Next Visit Plan  Continue lumbar stabilization and functional lower extremity strengthening, progress to seated on physioball marching next session. Continue manual therapy as needed for pain control.     PT Home Exercise Plan  02/14/18: Ab set x 10 5'' holds; bent knee raise x 20 alternating legs; Double knee to chest 3x30 seconds; Single knee to chest 3x30 seconds; Hamstring stretch with towel 3x30 seconds all 1x/day; 02/28/18: Postural strengthening rows, shoulder extension, scapular retraction with RTB 2x10 1x/day    Consulted and Agree with Plan of Care  Patient       Patient will benefit from skilled therapeutic intervention in order to improve the following deficits and impairments:  Decreased balance, Decreased endurance, Decreased mobility, Hypomobility,  Difficulty walking, Increased muscle spasms, Decreased range of motion, Improper body mechanics, Decreased activity tolerance, Decreased strength, Increased fascial restricitons, Impaired flexibility, Postural dysfunction, Pain  Visit Diagnosis: Chronic midline low back pain, with sciatica presence unspecified  Muscle weakness (generalized)  Other symptoms and signs involving the musculoskeletal system     Problem List Patient Active Problem List   Diagnosis Date Noted  . Ataxia 01/29/2016  . Chest pain 08/10/2015  . Dyspnea 08/10/2015  . Chronic bronchitis ([place]) 08/10/2015  . Tobacco abuse 08/10/2015  . Essential hypertension 08/10/2015  . Dyslipidemia 08/10/2015  . Chest pain with low risk for cardiac etiology 07/01/2015  . ABDOMINAL BLOATING 12/13/2009  . DIARRHEA 12/13/2009  . CHANGE IN BOWELS 12/13/2009  . BURSITIS, SHOULDER 06/19/2007  . HTN (hypertension) 06/19/2007   Clarene Critchley PT, DPT 12:18 PM, 03/07/18 [facility] [address] [place], [place], [address] Phone: [phone]   Fax:  [phone]  Name: Natasha [name] MRN: [id number] Date of Birth: [birth year]

## 2018-03-11 ENCOUNTER — Ambulatory Visit (HOSPITAL_COMMUNITY): Payer: Self-pay | Admitting: Psychiatry

## 2018-03-11 ENCOUNTER — Telehealth (HOSPITAL_COMMUNITY): Payer: Self-pay | Admitting: Internal Medicine

## 2018-03-11 ENCOUNTER — Ambulatory Visit (HOSPITAL_COMMUNITY): Payer: BLUE CROSS/BLUE SHIELD | Admitting: Physical Therapy

## 2018-03-11 NOTE — Telephone Encounter (Signed)
03/11/18  pt came in and said that she has had diarrhea for the last 2 days and was advised that we wouldn't see her per our policy

## 2018-03-14 ENCOUNTER — Encounter (HOSPITAL_COMMUNITY): Payer: Self-pay | Admitting: Physical Therapy

## 2018-03-14 ENCOUNTER — Ambulatory Visit (HOSPITAL_COMMUNITY): Payer: BLUE CROSS/BLUE SHIELD | Admitting: Physical Therapy

## 2018-03-14 DIAGNOSIS — M6281 Muscle weakness (generalized): Secondary | ICD-10-CM | POA: Diagnosis not present

## 2018-03-14 DIAGNOSIS — R29898 Other symptoms and signs involving the musculoskeletal system: Secondary | ICD-10-CM

## 2018-03-14 DIAGNOSIS — G8929 Other chronic pain: Secondary | ICD-10-CM

## 2018-03-14 DIAGNOSIS — M545 Low back pain: Secondary | ICD-10-CM | POA: Diagnosis not present

## 2018-03-14 NOTE — Therapy (Signed)
[facility] [place], [place], [address] Phone: [phone]   Fax:  [phone]  Physical Therapy Treatment  Patient Details  Name: Natasha [name] MRN: [id number] Date of Birth: Aug 25, 1960 Referring Provider: Erline Levine MD   Encounter Date: 03/14/2018  PT End of Session - 03/14/18 1206    Visit Number  7    Number of Visits  13    Date for PT Re-Evaluation  03/25/18   Mini re-assess 03/04/18   Authorization Type  [employer] other    Authorization Time Period  02/11/18 - 03/25/18    Authorization - Visit Number  7   Updated for count   Authorization - Number of Visits  30    PT Start Time  0932    PT Stop Time  1203    PT Time Calculation (min)  42 min    Activity Tolerance  Patient tolerated treatment well    Behavior During Therapy  [facility] for tasks assessed/performed       Past Medical History:  Diagnosis Date  . Arthritis   . COPD (chronic obstructive pulmonary disease) ([place])   . DDD (degenerative disc disease)   . DDD (degenerative disc disease), lumbosacral   . Depression   . Hemorrhoids   . Hyperlipidemia   . Hypertension   . Vitamin D deficiency     Past Surgical History:  Procedure Laterality Date  . CERVICAL DISC SURGERY  12/2010   Dr. Christella Noa  . MOUTH SURGERY    . TONSILLECTOMY     as teenager, in [place] Left 04/16/2013   Procedure: RELEASE TRIGGER FINGER/A-1 PULLEY LEFT THUMB;  Surgeon: Cammie Sickle., MD;  Location: [facility];  Service: Orthopedics;  Laterality: Left;  Marland Kitchen VAGINAL HYSTERECTOMY  06/2000   partial, Dr. Serafina Royals    There were no vitals filed for this visit.  Subjective Assessment - 03/14/18 1125    Subjective  Patient stated her stomach is feeling better but she is having back pain.     Pertinent History  Cervical disc surgery 2012, COPD, HTN    Limitations  Standing;Walking;Sitting;Lifting;House hold activities    How long can you sit  comfortably?  30 minutes    How long can you stand comfortably?  30 minutes    How long can you walk comfortably?  20 minutes    Diagnostic tests  MRI: Severe bilateral L5-S1 stenosis, L4-5 foraminal stenosis, L3-4 right foraminal annular fissure with associate stenosis    Patient Stated Goals  Strengthen her abdominals and back    Currently in Pain?  Yes    Pain Score  6     Pain Location  Back    Pain Orientation  Lower;Medial    Pain Descriptors / Indicators  Aching    Pain Type  Chronic pain    Pain Onset  More than a month ago                       [facility] Adult PT Treatment/Exercise - 03/14/18 0001      Exercises   Exercises  Lumbar      Lumbar Exercises: Stretches   Single Knee to Chest Stretch  Right;Left;3 reps;30 seconds    Double Knee to Chest Stretch  3 reps;30 seconds    Prone on Elbows Stretch  60 seconds;3 reps      Lumbar Exercises: Standing   Functional Squats  15 reps    Functional Squats Limitations  With demonstration and verbal cues for form    Forward Lunge  15 reps    Forward Lunge Limitations  Each LE    Other Standing Lumbar Exercises  Sidestepping 14 feet x 2 roundtrips with RTB. SLS vectors forward, side, back 5'' x 5 reps each. Palov press tandem stance with red theraband 20x with each lower extremity forward. Heel taps on 4'' step x 15 each lower extremity with cues to engage abdominals      Lumbar Exercises: Supine   Bridge  15 reps;2 seconds      Manual Therapy   Manual Therapy  Soft tissue mobilization    Manual therapy comments  Completed separately from all other skilled interventions    Soft tissue mobilization  Patient prone with bolster under ankles. Soft tissue mobilization to patient's bilateral lumbar paraspinals to decrease muscular restrictions and pain.              PT Education - 03/14/18 1126    Education Details  Discussed purpose and technique of interventions throughout session.     Person(s) Educated   Patient    Methods  Explanation    Comprehension  Verbalized understanding       PT Short Term Goals - 03/07/18 1208      PT SHORT TERM GOAL #1   Title  Patient will demonstrate understanding and report regular compliance with HEP to improve strength, mobility, and decrease pain.     Baseline  03/07/18: Patient stated she does her exercises regularly except the past couple days.     Time  3    Period  Weeks    Status  Achieved      PT SHORT TERM GOAL #2   Title  Patient will demonstrate an improvement of 1/2 MMT grade in all muscle groups tested as deficient to assist patient with functional mobility.     Baseline  03/07/18: Patient improved by 1/2 MMT grade in some, but not all muscle groups.     Time  3    Period  Weeks    Status  Partially Met      PT SHORT TERM GOAL #3   Title  Patient will report that her pain level has not exceeded a 5/10 over the course of a 1 week period, indicating better tolerance to daily activities.     Baseline  03/07/18: Patient reported an 8/10 pain this session.     Time  3    Period  Weeks    Status  On-going        PT Long Term Goals - 03/07/18 1210      PT LONG TERM GOAL #1   Title  Patient will demonstrate an improvement of 1 MMT grade in all muscle groups tested as deficient to assist patient with functional mobility.     Baseline  03/07/18: See MMT.     Time  6    Period  Weeks    Status  On-going      PT LONG TERM GOAL #2   Title  Patient will report that her pain level has not exceeded a 3/10 over the course of a 1 week period, indicating better tolerance to daily activities.     Baseline  03/07/18: Patient stated her pain has been an 8/10 today.     Time  6    Period  Weeks    Status  On-going  PT LONG TERM GOAL #3   Title  Patient will demonstrate ability to maintain single limb stance for 30 seconds on each lower extremity indicating improved strength and stability.     Baseline  03/07/18: Patient demonstrated single limb stance of  > 1  minute.     Time  6    Period  Weeks    Status  Achieved      PT LONG TERM GOAL #4   Title  Patient will perform the 5xSTS test in 13 seconds or less indicating improved balance and overall functional mobility.     Baseline  03/07/18: See objective measures.     Time  6    Period  Weeks    Status  On-going            Plan - 03/14/18 1207    Clinical Impression Statement  This session continued with established plan of care, but initiated prone on elbows stretch to trial effectiveness with this. Ended session with soft tissue mobilization to patient's lumbar paraspinals in order to reduce patient's pain and increase relaxation, as well as decrease muscular restrictions. Plan to continue with established plan of care and follow-up about effectiveness of prone on elbows at next session.    Rehab Potential  Fair    Clinical Impairments Affecting Rehab Potential  Positive: Motivated,  Negative: Chronicity    PT Frequency  2x / week    PT Duration  6 weeks    PT Treatment/Interventions  ADLs/Self Care Home Management;Aquatic Therapy;Electrical Stimulation;Moist Heat;DME Instruction;Gait training;Stair training;Functional mobility training;Therapeutic activities;Therapeutic exercise;Balance training;Neuromuscular re-education;Patient/family education;Manual techniques;Passive range of motion;Dry needling;Energy conservation;Taping    PT Next Visit Plan  Follow-up about physician appointment and effectiveness of prone on elbows. Continue lumbar stabilization and functional lower extremity strengthening, progress to seated on physioball marching next session. Continue manual therapy as needed for pain control.     PT Home Exercise Plan  02/14/18: Ab set x 10 5'' holds; bent knee raise x 20 alternating legs; Double knee to chest 3x30 seconds; Single knee to chest 3x30 seconds; Hamstring stretch with towel 3x30 seconds all 1x/day; 02/28/18: Postural strengthening rows, shoulder extension, scapular  retraction with RTB 2x10 1x/day    Consulted and Agree with Plan of Care  Patient       Patient will benefit from skilled therapeutic intervention in order to improve the following deficits and impairments:  Decreased balance, Decreased endurance, Decreased mobility, Hypomobility, Difficulty walking, Increased muscle spasms, Decreased range of motion, Improper body mechanics, Decreased activity tolerance, Decreased strength, Increased fascial restricitons, Impaired flexibility, Postural dysfunction, Pain  Visit Diagnosis: Chronic midline low back pain, with sciatica presence unspecified  Muscle weakness (generalized)  Other symptoms and signs involving the musculoskeletal system     Problem List Patient Active Problem List   Diagnosis Date Noted  . Ataxia 01/29/2016  . Chest pain 08/10/2015  . Dyspnea 08/10/2015  . Chronic bronchitis ([place]) 08/10/2015  . Tobacco abuse 08/10/2015  . Essential hypertension 08/10/2015  . Dyslipidemia 08/10/2015  . Chest pain with low risk for cardiac etiology 07/01/2015  . ABDOMINAL BLOATING 12/13/2009  . DIARRHEA 12/13/2009  . CHANGE IN BOWELS 12/13/2009  . BURSITIS, SHOULDER 06/19/2007  . HTN (hypertension) 06/19/2007   Clarene Critchley PT, DPT 12:09 PM, 03/14/18 [facility] [address] [place], [place], [address] Phone: [phone]   Fax:  [phone]  Name: Natasha [name] MRN: [id number] Date of Birth: April 27, 1961

## 2018-03-17 DIAGNOSIS — M5416 Radiculopathy, lumbar region: Secondary | ICD-10-CM | POA: Diagnosis not present

## 2018-03-17 DIAGNOSIS — M5137 Other intervertebral disc degeneration, lumbosacral region: Secondary | ICD-10-CM | POA: Diagnosis not present

## 2018-03-17 DIAGNOSIS — M47817 Spondylosis without myelopathy or radiculopathy, lumbosacral region: Secondary | ICD-10-CM | POA: Diagnosis not present

## 2018-03-17 DIAGNOSIS — M545 Low back pain: Secondary | ICD-10-CM | POA: Diagnosis not present

## 2018-03-18 ENCOUNTER — Ambulatory Visit (HOSPITAL_COMMUNITY): Payer: BLUE CROSS/BLUE SHIELD | Admitting: Physical Therapy

## 2018-03-18 ENCOUNTER — Encounter (HOSPITAL_COMMUNITY): Payer: Self-pay | Admitting: Physical Therapy

## 2018-03-18 DIAGNOSIS — G8929 Other chronic pain: Secondary | ICD-10-CM

## 2018-03-18 DIAGNOSIS — M545 Low back pain: Secondary | ICD-10-CM | POA: Diagnosis not present

## 2018-03-18 DIAGNOSIS — M6281 Muscle weakness (generalized): Secondary | ICD-10-CM | POA: Diagnosis not present

## 2018-03-18 DIAGNOSIS — R29898 Other symptoms and signs involving the musculoskeletal system: Secondary | ICD-10-CM

## 2018-03-18 NOTE — Therapy (Signed)
[place], [place], [address] Phone: [phone]   Fax:  [phone]  Physical Therapy Treatment  Patient Details  Name: Natasha [name] MRN: [id number] Date of Birth: 07/08/1960 Referring Provider: Erline Levine MD   Encounter Date: 03/18/2018  PT End of Session - 03/18/18 1126    Visit Number  8    Number of Visits  13    Date for PT Re-Evaluation  03/25/18   Mini re-assess 03/04/18   Authorization Type  [employer] other    Authorization Time Period  02/11/18 - 03/25/18    Authorization - Visit Number  8   Updated for count   Authorization - Number of Visits  30    PT Start Time  1122    PT Stop Time  1200    PT Time Calculation (min)  38 min    Activity Tolerance  Patient tolerated treatment well    Behavior During Therapy  [facility] for tasks assessed/performed       Past Medical History:  Diagnosis Date  . Arthritis   . COPD (chronic obstructive pulmonary disease) ([place])   . DDD (degenerative disc disease)   . DDD (degenerative disc disease), lumbosacral   . Depression   . Hemorrhoids   . Hyperlipidemia   . Hypertension   . Vitamin D deficiency     Past Surgical History:  Procedure Laterality Date  . CERVICAL DISC SURGERY  12/2010   Dr. Christella Noa  . MOUTH SURGERY    . TONSILLECTOMY     as teenager, in [place] Left 04/16/2013   Procedure: RELEASE TRIGGER FINGER/A-1 PULLEY LEFT THUMB;  Surgeon: Cammie Sickle., MD;  Location: [facility];  Service: Orthopedics;  Laterality: Left;  Marland Kitchen VAGINAL HYSTERECTOMY  06/2000   partial, Dr. Serafina Royals    There were no vitals filed for this visit.  Subjective Assessment - 03/18/18 1124    Subjective  She stated that she went to the neurologist and that she is planning to get anothr MRI and is considering back surgery.     Pertinent History  Cervical disc surgery 2012, COPD, HTN    Limitations   Standing;Walking;Sitting;Lifting;House hold activities    How long can you sit comfortably?  30 minutes    How long can you stand comfortably?  30 minutes    How long can you walk comfortably?  20 minutes    Diagnostic tests  MRI: Severe bilateral L5-S1 stenosis, L4-5 foraminal stenosis, L3-4 right foraminal annular fissure with associate stenosis    Patient Stated Goals  Strengthen her abdominals and back    Currently in Pain?  Yes    Pain Score  5     Pain Location  Back    Pain Orientation  Lower;Medial    Pain Descriptors / Indicators  Aching    Pain Type  Chronic pain    Pain Onset  More than a month ago                       [facility] Adult PT Treatment/Exercise - 03/18/18 0001      Exercises   Exercises  Lumbar      Lumbar Exercises: Stretches   Single Knee to Chest Stretch  Right;Left;3 reps;30 seconds    Double Knee to Chest Stretch  3 reps;30 seconds      Lumbar Exercises: Standing   Functional Squats  15  reps    Forward Lunge  15 reps    Forward Lunge Limitations  Each LE    Other Standing Lumbar Exercises  Sidestepping 14 feet x 2 roundtrips with RTB. Palov press tandem stance with red theraband 20x with each lower extremity forward. Heel taps on 6'' step x 15 each lower extremity with cues to engage abdominals       Lumbar Exercises: Seated   Other Seated Lumbar Exercises  Seated on medium physioball marching with abdominal set and 3 second holds each lower extremity x 20      Lumbar Exercises: Supine   Bridge  15 reps;2 seconds      Manual Therapy   Manual Therapy  Soft tissue mobilization    Manual therapy comments  Completed separately from all other skilled interventions    Soft tissue mobilization  Patient prone with bolster under ankles. Soft tissue mobilization to patient's bilateral lumbar paraspinals to decrease muscular restrictions and pain.              PT Education - 03/18/18 1125    Education Details  Discussed purpose and  technique of interventions throughout session.     Person(s) Educated  Patient    Methods  Explanation    Comprehension  Verbalized understanding       PT Short Term Goals - 03/07/18 1208      PT SHORT TERM GOAL #1   Title  Patient will demonstrate understanding and report regular compliance with HEP to improve strength, mobility, and decrease pain.     Baseline  03/07/18: Patient stated she does her exercises regularly except the past couple days.     Time  3    Period  Weeks    Status  Achieved      PT SHORT TERM GOAL #2   Title  Patient will demonstrate an improvement of 1/2 MMT grade in all muscle groups tested as deficient to assist patient with functional mobility.     Baseline  03/07/18: Patient improved by 1/2 MMT grade in some, but not all muscle groups.     Time  3    Period  Weeks    Status  Partially Met      PT SHORT TERM GOAL #3   Title  Patient will report that her pain level has not exceeded a 5/10 over the course of a 1 week period, indicating better tolerance to daily activities.     Baseline  03/07/18: Patient reported an 8/10 pain this session.     Time  3    Period  Weeks    Status  On-going        PT Long Term Goals - 03/07/18 1210      PT LONG TERM GOAL #1   Title  Patient will demonstrate an improvement of 1 MMT grade in all muscle groups tested as deficient to assist patient with functional mobility.     Baseline  03/07/18: See MMT.     Time  6    Period  Weeks    Status  On-going      PT LONG TERM GOAL #2   Title  Patient will report that her pain level has not exceeded a 3/10 over the course of a 1 week period, indicating better tolerance to daily activities.     Baseline  03/07/18: Patient stated her pain has been an 8/10 today.     Time  6    Period  Weeks  Status  On-going      PT LONG TERM GOAL #3   Title  Patient will demonstrate ability to maintain single limb stance for 30 seconds on each lower extremity indicating improved strength and  stability.     Baseline  03/07/18: Patient demonstrated single limb stance of > 1  minute.     Time  6    Period  Weeks    Status  Achieved      PT LONG TERM GOAL #4   Title  Patient will perform the 5xSTS test in 13 seconds or less indicating improved balance and overall functional mobility.     Baseline  03/07/18: See objective measures.     Time  6    Period  Weeks    Status  On-going            Plan - 03/18/18 1205    Clinical Impression Statement  This session continued with progression of lumbar stabilization and functional lower extremity strengthening. This session increased step height to 6'' with heel taps and added seated marching on physioball for improved lumbar stabilization. Plan to continue with progression of functional strengthening and lumbar stabilization and follow-up regarding when patient is planning to have MRI.     Rehab Potential  Fair    Clinical Impairments Affecting Rehab Potential  Positive: Motivated,  Negative: Chronicity    PT Frequency  2x / week    PT Duration  6 weeks    PT Treatment/Interventions  ADLs/Self Care Home Management;Aquatic Therapy;Electrical Stimulation;Moist Heat;DME Instruction;Gait training;Stair training;Functional mobility training;Therapeutic activities;Therapeutic exercise;Balance training;Neuromuscular re-education;Patient/family education;Manual techniques;Passive range of motion;Dry needling;Energy conservation;Taping    PT Next Visit Plan  Continue lumbar stabilization and functional lower extremity strengthening. Continue manual therapy as needed for pain control.     PT Home Exercise Plan  02/14/18: Ab set x 10 5'' holds; bent knee raise x 20 alternating legs; Double knee to chest 3x30 seconds; Single knee to chest 3x30 seconds; Hamstring stretch with towel 3x30 seconds all 1x/day; 02/28/18: Postural strengthening rows, shoulder extension, scapular retraction with RTB 2x10 1x/day    Consulted and Agree with Plan of Care  Patient        Patient will benefit from skilled therapeutic intervention in order to improve the following deficits and impairments:  Decreased balance, Decreased endurance, Decreased mobility, Hypomobility, Difficulty walking, Increased muscle spasms, Decreased range of motion, Improper body mechanics, Decreased activity tolerance, Decreased strength, Increased fascial restricitons, Impaired flexibility, Postural dysfunction, Pain  Visit Diagnosis: Chronic midline low back pain, with sciatica presence unspecified  Muscle weakness (generalized)  Other symptoms and signs involving the musculoskeletal system     Problem List Patient Active Problem List   Diagnosis Date Noted  . Ataxia 01/29/2016  . Chest pain 08/10/2015  . Dyspnea 08/10/2015  . Chronic bronchitis ([place]) 08/10/2015  . Tobacco abuse 08/10/2015  . Essential hypertension 08/10/2015  . Dyslipidemia 08/10/2015  . Chest pain with low risk for cardiac etiology 07/01/2015  . ABDOMINAL BLOATING 12/13/2009  . DIARRHEA 12/13/2009  . CHANGE IN BOWELS 12/13/2009  . BURSITIS, SHOULDER 06/19/2007  . HTN (hypertension) 06/19/2007   Clarene Critchley PT, DPT 12:07 PM, 03/18/18 [place], [place], [address] Phone: [phone]   Fax:  [phone]  Name: Natasha [name] MRN: [id number] Date of Birth: Oct 23, 1960

## 2018-03-21 ENCOUNTER — Encounter (HOSPITAL_COMMUNITY): Payer: Self-pay | Admitting: Physical Therapy

## 2018-03-21 ENCOUNTER — Ambulatory Visit (HOSPITAL_COMMUNITY): Payer: BLUE CROSS/BLUE SHIELD | Admitting: Physical Therapy

## 2018-03-21 DIAGNOSIS — G8929 Other chronic pain: Secondary | ICD-10-CM | POA: Diagnosis not present

## 2018-03-21 DIAGNOSIS — M6281 Muscle weakness (generalized): Secondary | ICD-10-CM

## 2018-03-21 DIAGNOSIS — M545 Low back pain: Secondary | ICD-10-CM | POA: Diagnosis not present

## 2018-03-21 DIAGNOSIS — R29898 Other symptoms and signs involving the musculoskeletal system: Secondary | ICD-10-CM

## 2018-03-21 NOTE — Therapy (Signed)
[place], [place], [address] Phone: [phone]   Fax:  [phone]  Physical Therapy Treatment  Patient Details  Name: Natasha [name] MRN: [id number] Date of Birth: [birth year] Referring Provider: Erline Levine MD   Encounter Date: 03/21/2018  PT End of Session - 03/21/18 1122    Visit Number  9    Number of Visits  13    Date for PT Re-Evaluation  03/25/18   Mini re-assess 03/04/18   Authorization Type  [employer] other    Authorization Time Period  02/11/18 - 03/25/18    Authorization - Visit Number  9   Updated for count   Authorization - Number of Visits  30    PT Start Time  1118    PT Stop Time  1158    PT Time Calculation (min)  40 min    Activity Tolerance  Patient tolerated treatment well    Behavior During Therapy  [facility] for tasks assessed/performed       Past Medical History:  Diagnosis Date  . Arthritis   . COPD (chronic obstructive pulmonary disease) ([place])   . DDD (degenerative disc disease)   . DDD (degenerative disc disease), lumbosacral   . Depression   . Hemorrhoids   . Hyperlipidemia   . Hypertension   . Vitamin D deficiency     Past Surgical History:  Procedure Laterality Date  . CERVICAL DISC SURGERY  12/2010   Dr. Christella Noa  . MOUTH SURGERY    . TONSILLECTOMY     as teenager, in [place] Left 04/16/2013   Procedure: RELEASE TRIGGER FINGER/A-1 PULLEY LEFT THUMB;  Surgeon: Cammie Sickle., MD;  Location: [place];  Service: Orthopedics;  Laterality: Left;  Marland Kitchen VAGINAL HYSTERECTOMY  06/2000   partial, Dr. Serafina Royals    There were no vitals filed for this visit.  Subjective Assessment - 03/21/18 1120    Subjective  Patient stated she has been trying to get ready for her garage sale tomorrow. She stated she is having back pain which she rates as a 6/10. Patient denied any sudden changes in weight.     Pertinent History  Cervical disc surgery  2012, COPD, HTN    Limitations  Standing;Walking;Sitting;Lifting;House hold activities    How long can you sit comfortably?  30 minutes    How long can you stand comfortably?  30 minutes    How long can you walk comfortably?  20 minutes    Diagnostic tests  MRI: Severe bilateral L5-S1 stenosis, L4-5 foraminal stenosis, L3-4 right foraminal annular fissure with associate stenosis    Patient Stated Goals  Strengthen her abdominals and back    Currently in Pain?  Yes    Pain Score  6     Pain Location  Back    Pain Orientation  Lower;Medial    Pain Descriptors / Indicators  Aching    Pain Type  Chronic pain    Pain Onset  More than a month ago                       [facility] Adult PT Treatment/Exercise - 03/21/18 0001      Lumbar Exercises: Stretches   Single Knee to Chest Stretch  Right;Left;3 reps;30 seconds    Double Knee to Chest Stretch  3 reps;30 seconds      Lumbar Exercises: Standing   Functional Squats  15 reps    Forward Lunge  15 reps    Forward Lunge Limitations  Each LE    Shoulder Extension Limitations  Standing on Airex marching 3'' holds with bilateral shoulder extension with RTB x 20 alterating legs    Other Standing Lumbar Exercises  Sidestepping 14 feet x 2 roundtrips with RTB. Palov press tandem stance with red theraband 20x with each lower extremity forward. Heel taps on 6'' step x 15 each lower extremity with cues to engage abdominals mirror for visual cues      Lumbar Exercises: Seated   Other Seated Lumbar Exercises  Seated on medium physioball marching with abdominal set and 3 second holds each lower extremity x 20      Lumbar Exercises: Supine   Bridge  15 reps;2 seconds   2x15 With red theraband around knees cue for alignment     Manual Therapy   Manual Therapy  Soft tissue mobilization    Manual therapy comments  Completed separately from all other skilled interventions    Soft tissue mobilization  Patient prone with bolster under ankles.  Soft tissue mobilization to patient's bilateral lumbar paraspinals and bilateral gluteals to decrease muscular restrictions and pain.             PT Education - 03/21/18 1121    Education Details  Discussed technique with exercises and purpose throughout session.     Person(s) Educated  Patient    Methods  Explanation    Comprehension  Verbalized understanding       PT Short Term Goals - 03/07/18 1208      PT SHORT TERM GOAL #1   Title  Patient will demonstrate understanding and report regular compliance with HEP to improve strength, mobility, and decrease pain.     Baseline  03/07/18: Patient stated she does her exercises regularly except the past couple days.     Time  3    Period  Weeks    Status  Achieved      PT SHORT TERM GOAL #2   Title  Patient will demonstrate an improvement of 1/2 MMT grade in all muscle groups tested as deficient to assist patient with functional mobility.     Baseline  03/07/18: Patient improved by 1/2 MMT grade in some, but not all muscle groups.     Time  3    Period  Weeks    Status  Partially Met      PT SHORT TERM GOAL #3   Title  Patient will report that her pain level has not exceeded a 5/10 over the course of a 1 week period, indicating better tolerance to daily activities.     Baseline  03/07/18: Patient reported an 8/10 pain this session.     Time  3    Period  Weeks    Status  On-going        PT Long Term Goals - 03/07/18 1210      PT LONG TERM GOAL #1   Title  Patient will demonstrate an improvement of 1 MMT grade in all muscle groups tested as deficient to assist patient with functional mobility.     Baseline  03/07/18: See MMT.     Time  6    Period  Weeks    Status  On-going      PT LONG TERM GOAL #2   Title  Patient will report that her pain level has not exceeded a 3/10 over the course of a 1  week period, indicating better tolerance to daily activities.     Baseline  03/07/18: Patient stated her pain has been an 8/10 today.      Time  6    Period  Weeks    Status  On-going      PT LONG TERM GOAL #3   Title  Patient will demonstrate ability to maintain single limb stance for 30 seconds on each lower extremity indicating improved strength and stability.     Baseline  03/07/18: Patient demonstrated single limb stance of > 1  minute.     Time  6    Period  Weeks    Status  Achieved      PT LONG TERM GOAL #4   Title  Patient will perform the 5xSTS test in 13 seconds or less indicating improved balance and overall functional mobility.     Baseline  03/07/18: See objective measures.     Time  6    Period  Weeks    Status  On-going            Plan - 03/21/18 1202    Clinical Impression Statement  This session continued with focus on lumbar stabilization and functional lower extremity strengthening. This session added standing on foam alternating lower extremities with shoulder extension to challenge patient's balance, core stability and postural strengthening. This session also performed soft tissue mobilization to patient's bilateral gluteals with noted restrictions in gluteals bilaterally.     Rehab Potential  Fair    Clinical Impairments Affecting Rehab Potential  Positive: Motivated,  Negative: Chronicity    PT Frequency  2x / week    PT Duration  6 weeks    PT Treatment/Interventions  ADLs/Self Care Home Management;Aquatic Therapy;Electrical Stimulation;Moist Heat;DME Instruction;Gait training;Stair training;Functional mobility training;Therapeutic activities;Therapeutic exercise;Balance training;Neuromuscular re-education;Patient/family education;Manual techniques;Passive range of motion;Dry needling;Energy conservation;Taping    PT Next Visit Plan  Continue lumbar stabilization and functional lower extremity strengthening. Continue manual therapy as needed for pain control.     PT Home Exercise Plan  02/14/18: Ab set x 10 5'' holds; bent knee raise x 20 alternating legs; Double knee to chest 3x30 seconds;  Single knee to chest 3x30 seconds; Hamstring stretch with towel 3x30 seconds all 1x/day; 02/28/18: Postural strengthening rows, shoulder extension, scapular retraction with RTB 2x10 1x/day    Consulted and Agree with Plan of Care  Patient       Patient will benefit from skilled therapeutic intervention in order to improve the following deficits and impairments:  Decreased balance, Decreased endurance, Decreased mobility, Hypomobility, Difficulty walking, Increased muscle spasms, Decreased range of motion, Improper body mechanics, Decreased activity tolerance, Decreased strength, Increased fascial restricitons, Impaired flexibility, Postural dysfunction, Pain  Visit Diagnosis: Chronic midline low back pain, with sciatica presence unspecified  Muscle weakness (generalized)  Other symptoms and signs involving the musculoskeletal system     Problem List Patient Active Problem List   Diagnosis Date Noted  . Ataxia 01/29/2016  . Chest pain 08/10/2015  . Dyspnea 08/10/2015  . Chronic bronchitis ([place]) 08/10/2015  . Tobacco abuse 08/10/2015  . Essential hypertension 08/10/2015  . Dyslipidemia 08/10/2015  . Chest pain with low risk for cardiac etiology 07/01/2015  . ABDOMINAL BLOATING 12/13/2009  . DIARRHEA 12/13/2009  . CHANGE IN BOWELS 12/13/2009  . BURSITIS, SHOULDER 06/19/2007  . HTN (hypertension) 06/19/2007   Clarene Critchley PT, DPT 12:07 PM, 03/21/18 [place], [place], [address] Phone: [phone]  Fax:  [phone]  Name: Natasha [name] MRN: [id number] Date of Birth: Feb 25, 1961

## 2018-03-24 DIAGNOSIS — J449 Chronic obstructive pulmonary disease, unspecified: Secondary | ICD-10-CM | POA: Diagnosis not present

## 2018-03-24 DIAGNOSIS — Z23 Encounter for immunization: Secondary | ICD-10-CM | POA: Diagnosis not present

## 2018-03-24 DIAGNOSIS — R252 Cramp and spasm: Secondary | ICD-10-CM | POA: Diagnosis not present

## 2018-03-24 DIAGNOSIS — L659 Nonscarring hair loss, unspecified: Secondary | ICD-10-CM | POA: Diagnosis not present

## 2018-03-24 DIAGNOSIS — E7849 Other hyperlipidemia: Secondary | ICD-10-CM | POA: Diagnosis not present

## 2018-03-24 DIAGNOSIS — I700 Atherosclerosis of aorta: Secondary | ICD-10-CM | POA: Diagnosis not present

## 2018-03-24 DIAGNOSIS — Z1389 Encounter for screening for other disorder: Secondary | ICD-10-CM | POA: Diagnosis not present

## 2018-03-24 DIAGNOSIS — I10 Essential (primary) hypertension: Secondary | ICD-10-CM | POA: Diagnosis not present

## 2018-03-26 ENCOUNTER — Encounter (HOSPITAL_COMMUNITY): Payer: Self-pay

## 2018-03-26 ENCOUNTER — Encounter

## 2018-03-27 ENCOUNTER — Ambulatory Visit (HOSPITAL_COMMUNITY): Payer: BLUE CROSS/BLUE SHIELD | Admitting: Physical Therapy

## 2018-03-27 ENCOUNTER — Other Ambulatory Visit (HOSPITAL_COMMUNITY): Payer: Self-pay | Admitting: Neurosurgery

## 2018-03-27 ENCOUNTER — Encounter (HOSPITAL_COMMUNITY): Payer: Self-pay | Admitting: Physical Therapy

## 2018-03-27 DIAGNOSIS — M545 Low back pain: Secondary | ICD-10-CM | POA: Diagnosis not present

## 2018-03-27 DIAGNOSIS — M6281 Muscle weakness (generalized): Secondary | ICD-10-CM | POA: Diagnosis not present

## 2018-03-27 DIAGNOSIS — M5416 Radiculopathy, lumbar region: Secondary | ICD-10-CM

## 2018-03-27 DIAGNOSIS — G8929 Other chronic pain: Secondary | ICD-10-CM

## 2018-03-27 DIAGNOSIS — R29898 Other symptoms and signs involving the musculoskeletal system: Secondary | ICD-10-CM | POA: Diagnosis not present

## 2018-03-27 NOTE — Therapy (Signed)
[facility] [place], [place], [address] Phone: [phone]   Fax:  [phone]  Physical Therapy Treatment  Patient Details  Name: Natasha [name] MRN: [id number] Date of Birth: [birth year] Referring Provider: Erline Levine MD   Encounter Date: 03/27/2018  PT End of Session - 03/27/18 1128    Visit Number  10    Number of Visits  13    Date for PT Re-Evaluation  03/28/18   Mini re-assess 03/04/18. Updated for certification end date   Authorization Type  [employer] other    Authorization Time Period  02/11/18 - 03/25/18    Authorization - Visit Number  10   Updated for count   Authorization - Number of Visits  30    PT Start Time  1120    PT Stop Time  1203    PT Time Calculation (min)  43 min    Activity Tolerance  Patient tolerated treatment well    Behavior During Therapy  WFL for tasks assessed/performed       Past Medical History:  Diagnosis Date  . Arthritis   . COPD (chronic obstructive pulmonary disease) ([place])   . DDD (degenerative disc disease)   . DDD (degenerative disc disease), lumbosacral   . Depression   . Hemorrhoids   . Hyperlipidemia   . Hypertension   . Vitamin D deficiency     Past Surgical History:  Procedure Laterality Date  . CERVICAL DISC SURGERY  12/2010   Dr. Christella Noa  . MOUTH SURGERY    . TONSILLECTOMY     as teenager, in [place] Left 04/16/2013   Procedure: RELEASE TRIGGER FINGER/A-1 PULLEY LEFT THUMB;  Surgeon: Cammie Sickle., MD;  Location: [facility];  Service: Orthopedics;  Laterality: Left;  [facility] Kitchen VAGINAL HYSTERECTOMY  06/2000   partial, Dr. Serafina Royals    There were no vitals filed for this visit.  Subjective Assessment - 03/27/18 1122    Subjective  Patient stated that her upper back has been hurting recently which she attributed to lifting more than usual. She said currently it's more in her lower back.     Pertinent History  Cervical disc  surgery 2012, COPD, HTN    Limitations  Standing;Walking;Sitting;Lifting;House hold activities    How long can you sit comfortably?  30 minutes    How long can you stand comfortably?  30 minutes    How long can you walk comfortably?  20 minutes    Diagnostic tests  MRI: Severe bilateral L5-S1 stenosis, L4-5 foraminal stenosis, L3-4 right foraminal annular fissure with associate stenosis    Patient Stated Goals  Strengthen her abdominals and back    Currently in Pain?  Yes    Pain Score  6     Pain Location  Back    Pain Orientation  Lower;Medial    Pain Descriptors / Indicators  Aching    Pain Type  Chronic pain    Pain Onset  More than a month ago                       [facility] Adult PT Treatment/Exercise - 03/27/18 0001      Lumbar Exercises: Stretches   Single Knee to Chest Stretch  Right;Left;3 reps;30 seconds    Double Knee to Chest Stretch  3 reps;30 seconds      Lumbar Exercises: Standing   Functional Squats  15 reps  Functional Squats Limitations  without upper extremity assistance, chair behind for cue    Forward Lunge  15 reps    Forward Lunge Limitations  Each LE on BOSU for added challenge    Shoulder Extension Limitations  Standing on Airex Marching 3'' holds with bilateral shoulder extension with RTB x 20 alternating legs    Other Standing Lumbar Exercises  Sidestepping 14 feet x 2 roundtrips with RTB. Palov press tandem stance with red theraband 20x with each lower extremity forward. Heel taps on 6'' step x 15 each lower extremity with cues to engage abdominals       Lumbar Exercises: Seated   Other Seated Lumbar Exercises  Seated on medium physioball marching with abdominal set and 3 second holds each lower extremity x 20      Lumbar Exercises: Supine   Bridge  15 reps;2 seconds   2x15 repetitions     Manual Therapy   Manual Therapy  Soft tissue mobilization    Manual therapy comments  Completed separately from all other skilled interventions     Soft tissue mobilization  Patient prone with bolster under ankles. Soft tissue mobilization to patient's bilateral lumbar paraspinals and bilateral gluteals to decrease muscular restrictions and pain.             PT Education - 03/27/18 1128    Education Details  Discussed purpose and technique of interventions throughout session.     Person(s) Educated  Patient    Methods  Explanation    Comprehension  Verbalized understanding       PT Short Term Goals - 03/07/18 1208      PT SHORT TERM GOAL #1   Title  Patient will demonstrate understanding and report regular compliance with HEP to improve strength, mobility, and decrease pain.     Baseline  03/07/18: Patient stated she does her exercises regularly except the past couple days.     Time  3    Period  Weeks    Status  Achieved      PT SHORT TERM GOAL #2   Title  Patient will demonstrate an improvement of 1/2 MMT grade in all muscle groups tested as deficient to assist patient with functional mobility.     Baseline  03/07/18: Patient improved by 1/2 MMT grade in some, but not all muscle groups.     Time  3    Period  Weeks    Status  Partially Met      PT SHORT TERM GOAL #3   Title  Patient will report that her pain level has not exceeded a 5/10 over the course of a 1 week period, indicating better tolerance to daily activities.     Baseline  03/07/18: Patient reported an 8/10 pain this session.     Time  3    Period  Weeks    Status  On-going        PT Long Term Goals - 03/07/18 1210      PT LONG TERM GOAL #1   Title  Patient will demonstrate an improvement of 1 MMT grade in all muscle groups tested as deficient to assist patient with functional mobility.     Baseline  03/07/18: See MMT.     Time  6    Period  Weeks    Status  On-going      PT LONG TERM GOAL #2   Title  Patient will report that her pain level has not exceeded a 3/10 over  the course of a 1 week period, indicating better tolerance to daily activities.      Baseline  03/07/18: Patient stated her pain has been an 8/10 today.     Time  6    Period  Weeks    Status  On-going      PT LONG TERM GOAL #3   Title  Patient will demonstrate ability to maintain single limb stance for 30 seconds on each lower extremity indicating improved strength and stability.     Baseline  03/07/18: Patient demonstrated single limb stance of > 1  minute.     Time  6    Period  Weeks    Status  Achieved      PT LONG TERM GOAL #4   Title  Patient will perform the 5xSTS test in 13 seconds or less indicating improved balance and overall functional mobility.     Baseline  03/07/18: See objective measures.     Time  6    Period  Weeks    Status  On-going            Plan - 03/27/18 1212    Clinical Impression Statement  This session continued with established plan of care. Challenged patient with functional squatting by having patient perform exercise without hand hold assistance. In addition increased challenge with exercises by having patient perform forward lunges onto BOSU with some noted wobbliness, however patient was able to perform with verbal cues. Plan to re-assess patient next session.     Rehab Potential  Fair    Clinical Impairments Affecting Rehab Potential  Positive: Motivated,  Negative: Chronicity    PT Frequency  2x / week    PT Duration  6 weeks    PT Treatment/Interventions  ADLs/Self Care Home Management;Aquatic Therapy;Electrical Stimulation;Moist Heat;DME Instruction;Gait training;Stair training;Functional mobility training;Therapeutic activities;Therapeutic exercise;Balance training;Neuromuscular re-education;Patient/family education;Manual techniques;Passive range of motion;Dry needling;Energy conservation;Taping    PT Next Visit Plan  Re-assess next session. Provide list of local massage therapists. Provide piriformis stretch for HEP.     PT Home Exercise Plan  02/14/18: Ab set x 10 5'' holds; bent knee raise x 20 alternating legs; Double knee to  chest 3x30 seconds; Single knee to chest 3x30 seconds; Hamstring stretch with towel 3x30 seconds all 1x/day; 02/28/18: Postural strengthening rows, shoulder extension, scapular retraction with RTB 2x10 1x/day    Consulted and Agree with Plan of Care  Patient       Patient will benefit from skilled therapeutic intervention in order to improve the following deficits and impairments:  Decreased balance, Decreased endurance, Decreased mobility, Hypomobility, Difficulty walking, Increased muscle spasms, Decreased range of motion, Improper body mechanics, Decreased activity tolerance, Decreased strength, Increased fascial restricitons, Impaired flexibility, Postural dysfunction, Pain  Visit Diagnosis: Chronic midline low back pain, with sciatica presence unspecified  Muscle weakness (generalized)     Problem List Patient Active Problem List   Diagnosis Date Noted  . Ataxia 01/29/2016  . Chest pain 08/10/2015  . Dyspnea 08/10/2015  . Chronic bronchitis ([place]) 08/10/2015  . Tobacco abuse 08/10/2015  . Essential hypertension 08/10/2015  . Dyslipidemia 08/10/2015  . Chest pain with low risk for cardiac etiology 07/01/2015  . ABDOMINAL BLOATING 12/13/2009  . DIARRHEA 12/13/2009  . CHANGE IN BOWELS 12/13/2009  . BURSITIS, SHOULDER 06/19/2007  . HTN (hypertension) 06/19/2007   Clarene Critchley PT, DPT 12:14 PM, 03/27/18 [place], [place], [address] Phone: [phone]  Fax:  [phone]  Name: Natasha [name] MRN: [id number] Date of Birth: 09/11/1960

## 2018-03-28 ENCOUNTER — Ambulatory Visit (HOSPITAL_COMMUNITY): Payer: BLUE CROSS/BLUE SHIELD | Admitting: Physical Therapy

## 2018-03-28 ENCOUNTER — Encounter (HOSPITAL_COMMUNITY): Payer: Self-pay | Admitting: Physical Therapy

## 2018-03-28 ENCOUNTER — Other Ambulatory Visit (HOSPITAL_COMMUNITY): Payer: Self-pay | Admitting: Neurosurgery

## 2018-03-28 DIAGNOSIS — R29898 Other symptoms and signs involving the musculoskeletal system: Secondary | ICD-10-CM

## 2018-03-28 DIAGNOSIS — M545 Low back pain: Secondary | ICD-10-CM | POA: Diagnosis not present

## 2018-03-28 DIAGNOSIS — M6281 Muscle weakness (generalized): Secondary | ICD-10-CM

## 2018-03-28 DIAGNOSIS — M5416 Radiculopathy, lumbar region: Secondary | ICD-10-CM

## 2018-03-28 DIAGNOSIS — G8929 Other chronic pain: Secondary | ICD-10-CM | POA: Diagnosis not present

## 2018-03-28 NOTE — Therapy (Signed)
[facility] [address] [place], [place], [address] Phone: [phone]   Fax:  [phone]  Physical Therapy Treatment / Progress  Note / Discharge summary  Patient Details  Name: Natasha [name] MRN: [id number] Date of Birth: 13-May-1961 Referring Provider (PT): Erline Levine MD   Encounter Date: 03/28/2018    Progress Note Reporting Period 03/07/18 to 03/28/18  See note below for Objective Data and Assessment of Progress/Goals.       PHYSICAL THERAPY DISCHARGE SUMMARY  Visits from Start of Care: 11  Current functional level related to goals / functional outcomes: See below   Remaining deficits: See below  Education / Equipment: See below Plan: Patient agrees to discharge.  Patient goals were partially met. Patient is being discharged due to being pleased with the current functional level.  ?????           PT End of Session - 03/28/18 1040    Visit Number  11    Number of Visits  13    Date for PT Re-Evaluation  03/28/18   Mini re-assess 03/04/18. Updated for certification end date   Authorization Type  BC[employer] other    Authorization Time Period  02/11/18 - 03/25/18    Authorization - Visit Number  11   Updated for count   Authorization - Number of Visits  30    PT Start Time  3734    PT Stop Time  1059    PT Time Calculation (min)  24 min    Activity Tolerance  Patient tolerated treatment well    Behavior During Therapy  WFL for tasks assessed/performed       Past Medical History:  Diagnosis Date  . Arthritis   . COPD (chronic obstructive pulmonary disease) ([place])   . DDD (degenerative disc disease)   . DDD (degenerative disc disease), lumbosacral   . Depression   . Hemorrhoids   . Hyperlipidemia   . Hypertension   . Vitamin D deficiency     Past Surgical History:  Procedure Laterality Date  . CERVICAL DISC SURGERY  12/2010   Dr. Christella Noa  . MOUTH SURGERY    . TONSILLECTOMY     as teenager, in [place] Left 04/16/2013   Procedure: RELEASE TRIGGER FINGER/A-1 PULLEY LEFT THUMB;  Surgeon: Cammie Sickle., MD;  Location: [facility];  Service: Orthopedics;  Laterality: Left;  Marland Kitchen VAGINAL HYSTERECTOMY  06/2000   partial, Dr. Serafina Royals    There were no vitals filed for this visit.  Subjective Assessment - 03/28/18 1109    Subjective  Patient stated she's feeling like she has improved with therapy. She reported feeling ready to be discharged to continue exercises at home and possibly at the gym.     Pertinent History  Cervical disc surgery 2012, COPD, HTN    Limitations  Standing;Walking;Sitting;Lifting;House hold activities    How long can you sit comfortably?  30 minutes    How long can you stand comfortably?  30 minutes    How long can you walk comfortably?  20 minutes    Diagnostic tests  MRI: Severe bilateral L5-S1 stenosis, L4-5 foraminal stenosis, L3-4 right foraminal annular fissure with associate stenosis    Patient Stated Goals  Strengthen her abdominals and back    Currently in Pain?  Yes    Pain Score  5     Pain Location  Back    Pain Orientation  Lower    Pain Descriptors / Indicators  Aching    Pain Type  Chronic pain    Pain Onset  More than a month ago         [facility] PT Assessment - 03/28/18 0001      Assessment   Medical Diagnosis  Radiculopathy, Lumbar Region    Referring Provider (PT)  Erline Levine MD      Observation/Other Assessments   Focus on Therapeutic Outcomes (FOTO)   54% (was 46 then 47%)      Posture/Postural Control   Posture/Postural Control  Postural limitations    Postural Limitations  Rounded Shoulders;Forward head;Increased thoracic kyphosis      AROM   Lumbar Flexion  25% limited    Lumbar Extension  WNL    Lumbar - Right Side Bend  WNL    Lumbar - Left Side Bend  WNL    Lumbar - Right Rotation  WNL    Lumbar - Left Rotation  WNL      Strength   Right Hip Flexion  4+/5   was 4   Right Hip  Extension  4+/5   was 4   Right Hip ABduction  5/5   was 4 then 4+   Left Hip Flexion  4+/5   was 4 then 4+   Left Hip Extension  4+/5   was 4   Left Hip ABduction  5/5   was 4 then 4+   Right Knee Flexion  5/5    Right Knee Extension  5/5    Left Knee Flexion  5/5    Left Knee Extension  5/5    Right Ankle Dorsiflexion  5/5    Left Ankle Dorsiflexion  5/5      Flexibility   Hamstrings  10% limited bilaterally      Palpation   Palpation comment  Minimal muscular restrictions bilaterally      Static Standing Balance   Static Standing - Balance Support  No upper extremity supported    Static Standing Balance -  Activities   Single Leg Stance - Right Leg;Single Leg Stance - Left Leg    Static Standing - Comment/# of Minutes  >1 minute right 45 seconds on the left      Standardized Balance Assessment   Five times sit to stand comments   10.86 seconds from standard height chair without upper extremities                           PT Education - 03/28/18 1110    Education Details  Discussed re-evaluation findings and plan to discharge at this time, local gym, massage therapists and updated HEP.     Person(s) Educated  Patient    Methods  Explanation;Demonstration;Handout    Comprehension  Verbalized understanding;Returned demonstration       PT Short Term Goals - 03/28/18 1103      PT SHORT TERM GOAL #1   Title  Patient will demonstrate understanding and report regular compliance with HEP to improve strength, mobility, and decrease pain.     Baseline  03/07/18: Patient stated she does her exercises regularly except the past couple days.     Time  3    Period  Weeks    Status  Achieved      PT SHORT TERM GOAL #2   Title  Patient will demonstrate an improvement of 1/2 MMT grade in all muscle groups tested as deficient  to assist patient with functional mobility.     Baseline  03/28/18: Achieved see MMT.     Time  3    Period  Weeks    Status  Achieved       PT SHORT TERM GOAL #3   Title  Patient will report that her pain level has not exceeded a 5/10 over the course of a 1 week period, indicating better tolerance to daily activities.     Baseline  03/28/18: Patient reported a 6/10 pain at previous session, current sesison 5/10.     Time  3    Period  Weeks    Status  On-going        PT Long Term Goals - 03/28/18 1104      PT LONG TERM GOAL #1   Title  Patient will demonstrate an improvement of 1 MMT grade in all muscle groups tested as deficient to assist patient with functional mobility.     Baseline  03/28/18: Patient improved MMT by 1 in some muscle groups but not all, see MMT.     Time  6    Period  Weeks    Status  Partially Met      PT LONG TERM GOAL #2   Title  Patient will report that her pain level has not exceeded a 3/10 over the course of a 1 week period, indicating better tolerance to daily activities.     Baseline  03/28/18: Patient reported a 6/10 pain at previous session, current sesison 5/10.     Time  6    Period  Weeks    Status  On-going      PT LONG TERM GOAL #3   Title  Patient will demonstrate ability to maintain single limb stance for 30 seconds on each lower extremity indicating improved strength and stability.     Baseline  03/07/18: Patient demonstrated single limb stance of > 1  minute.     Time  6    Period  Weeks    Status  Achieved      PT LONG TERM GOAL #4   Title  Patient will perform the 5xSTS test in 13 seconds or less indicating improved balance and overall functional mobility.     Baseline  03/28/18: Patient performed in 10.86 seconds.    Time  6    Period  Weeks    Status  Achieved            Plan - 03/28/18 1111    Clinical Impression Statement  This session performed a re-assessment of patient's progress towards goals. Patient achieved 2/3 short term goals and achieved 2/4 long term goals and partially met  long term goals. Patient has reported functional improvements and demonstrated  improvements on FOTO. Patient continues to be limited by strength in some muscle groups and with back pain which she reported was currently a 5/10. Patient stated she is pleased with the progress she had made and reported feeling ready to be discharged to do exercises at home. Therapist educated patient on updated HEP, local gym, and local massage therapists as well as the benefit of continuing exercises. Patient is being discharged at this time with the knowledge that she can contact clinic with questions in the future and return for therapy with a  physician referral if needed.     Rehab Potential  Fair    Clinical Impairments Affecting Rehab Potential  Positive: Motivated,  Negative: Chronicity    PT Frequency  2x / week    PT Duration  6 weeks    PT Treatment/Interventions  ADLs/Self Care Home Management;Aquatic Therapy;Electrical Stimulation;Moist Heat;DME Instruction;Gait training;Stair training;Functional mobility training;Therapeutic activities;Therapeutic exercise;Balance training;Neuromuscular re-education;Patient/family education;Manual techniques;Passive range of motion;Dry needling;Energy conservation;Taping    PT Next Visit Plan  Discharged    PT Home Exercise Plan  02/14/18: Ab set x 10 5'' holds; bent knee raise x 20 alternating legs; Double knee to chest 3x30 seconds; Single knee to chest 3x30 seconds; Hamstring stretch with towel 3x30 seconds all 1x/day; 02/28/18: Postural strengthening rows, shoulder extension, scapular retraction with RTB 2x10 1x/day; 03/28/18: Hamstring and piriformis stretch seated 3x30 seconds each leg 1x/day    Consulted and Agree with Plan of Care  Patient       Patient will benefit from skilled therapeutic intervention in order to improve the following deficits and impairments:  Decreased balance, Decreased endurance, Decreased mobility, Hypomobility, Difficulty walking, Increased muscle spasms, Decreased range of motion, Improper body mechanics, Decreased activity  tolerance, Decreased strength, Increased fascial restricitons, Impaired flexibility, Postural dysfunction, Pain  Visit Diagnosis: Chronic midline low back pain, with sciatica presence unspecified  Muscle weakness (generalized)  Other symptoms and signs involving the musculoskeletal system     Problem List Patient Active Problem List   Diagnosis Date Noted  . Ataxia 01/29/2016  . Chest pain 08/10/2015  . Dyspnea 08/10/2015  . Chronic bronchitis ([place]) 08/10/2015  . Tobacco abuse 08/10/2015  . Essential hypertension 08/10/2015  . Dyslipidemia 08/10/2015  . Chest pain with low risk for cardiac etiology 07/01/2015  . ABDOMINAL BLOATING 12/13/2009  . DIARRHEA 12/13/2009  . CHANGE IN BOWELS 12/13/2009  . BURSITIS, SHOULDER 06/19/2007  . HTN (hypertension) 06/19/2007   Clarene Critchley PT, DPT 11:15 AM, 03/28/18 [facility] [address] [place], [place], [address] Phone: [phone]   Fax:  [phone]  Name: Natasha [name] MRN: [id number] Date of Birth: July 21, 1960

## 2018-04-02 ENCOUNTER — Ambulatory Visit (HOSPITAL_COMMUNITY)
Admission: RE | Admit: 2018-04-02 | Discharge: 2018-04-02 | Disposition: A | Discharge status: Home / Self Care | Payer: BLUE CROSS/BLUE SHIELD | Source: Ambulatory Visit | Attending: Neurosurgery | Admitting: Neurosurgery

## 2018-04-02 DIAGNOSIS — M48061 Spinal stenosis, lumbar region without neurogenic claudication: Secondary | ICD-10-CM | POA: Diagnosis not present

## 2018-04-02 DIAGNOSIS — M5416 Radiculopathy, lumbar region: Secondary | ICD-10-CM | POA: Diagnosis not present

## 2018-04-02 DIAGNOSIS — M5116 Intervertebral disc disorders with radiculopathy, lumbar region: Secondary | ICD-10-CM | POA: Diagnosis not present

## 2018-04-10 DIAGNOSIS — J449 Chronic obstructive pulmonary disease, unspecified: Secondary | ICD-10-CM | POA: Diagnosis not present

## 2018-04-10 DIAGNOSIS — Z13820 Encounter for screening for osteoporosis: Secondary | ICD-10-CM | POA: Diagnosis not present

## 2018-04-15 DIAGNOSIS — L650 Telogen effluvium: Secondary | ICD-10-CM | POA: Diagnosis not present

## 2018-04-15 DIAGNOSIS — L649 Androgenic alopecia, unspecified: Secondary | ICD-10-CM | POA: Diagnosis not present

## 2018-04-15 DIAGNOSIS — L821 Other seborrheic keratosis: Secondary | ICD-10-CM | POA: Diagnosis not present

## 2018-04-15 DIAGNOSIS — Z79899 Other long term (current) drug therapy: Secondary | ICD-10-CM | POA: Diagnosis not present

## 2018-05-07 DIAGNOSIS — M5137 Other intervertebral disc degeneration, lumbosacral region: Secondary | ICD-10-CM | POA: Diagnosis not present

## 2018-05-07 DIAGNOSIS — M545 Low back pain: Secondary | ICD-10-CM | POA: Diagnosis not present

## 2018-05-07 DIAGNOSIS — M47817 Spondylosis without myelopathy or radiculopathy, lumbosacral region: Secondary | ICD-10-CM | POA: Diagnosis not present

## 2018-05-07 DIAGNOSIS — M5416 Radiculopathy, lumbar region: Secondary | ICD-10-CM | POA: Diagnosis not present

## 2018-06-16 DIAGNOSIS — M5137 Other intervertebral disc degeneration, lumbosacral region: Secondary | ICD-10-CM | POA: Diagnosis not present

## 2018-10-07 DIAGNOSIS — I10 Essential (primary) hypertension: Secondary | ICD-10-CM | POA: Diagnosis not present

## 2018-10-07 DIAGNOSIS — E7849 Other hyperlipidemia: Secondary | ICD-10-CM | POA: Diagnosis not present

## 2018-10-09 DIAGNOSIS — R82998 Other abnormal findings in urine: Secondary | ICD-10-CM | POA: Diagnosis not present

## 2018-10-09 DIAGNOSIS — I10 Essential (primary) hypertension: Secondary | ICD-10-CM | POA: Diagnosis not present

## 2018-10-13 DIAGNOSIS — B351 Tinea unguium: Secondary | ICD-10-CM | POA: Diagnosis not present

## 2018-10-13 DIAGNOSIS — L659 Nonscarring hair loss, unspecified: Secondary | ICD-10-CM | POA: Diagnosis not present

## 2018-10-13 DIAGNOSIS — R252 Cramp and spasm: Secondary | ICD-10-CM | POA: Diagnosis not present

## 2018-10-13 DIAGNOSIS — E663 Overweight: Secondary | ICD-10-CM | POA: Diagnosis not present

## 2018-10-13 DIAGNOSIS — Z1331 Encounter for screening for depression: Secondary | ICD-10-CM | POA: Diagnosis not present

## 2018-10-13 DIAGNOSIS — Z0000 Encounter for general adult medical examination without abnormal findings: Secondary | ICD-10-CM | POA: Diagnosis not present

## 2018-11-12 DIAGNOSIS — E785 Hyperlipidemia, unspecified: Secondary | ICD-10-CM | POA: Insufficient documentation

## 2018-11-12 DIAGNOSIS — Z01419 Encounter for gynecological examination (general) (routine) without abnormal findings: Secondary | ICD-10-CM | POA: Diagnosis not present

## 2018-11-12 DIAGNOSIS — M479 Spondylosis, unspecified: Secondary | ICD-10-CM | POA: Insufficient documentation

## 2018-11-12 DIAGNOSIS — Z6828 Body mass index (BMI) 28.0-28.9, adult: Secondary | ICD-10-CM | POA: Diagnosis not present

## 2018-11-12 DIAGNOSIS — Z1231 Encounter for screening mammogram for malignant neoplasm of breast: Secondary | ICD-10-CM | POA: Diagnosis not present

## 2018-12-30 ENCOUNTER — Other Ambulatory Visit: Payer: Self-pay | Admitting: Internal Medicine

## 2018-12-30 ENCOUNTER — Other Ambulatory Visit (HOSPITAL_COMMUNITY): Payer: Self-pay | Admitting: Internal Medicine

## 2018-12-30 DIAGNOSIS — F17210 Nicotine dependence, cigarettes, uncomplicated: Secondary | ICD-10-CM

## 2018-12-30 DIAGNOSIS — J449 Chronic obstructive pulmonary disease, unspecified: Secondary | ICD-10-CM

## 2019-01-15 ENCOUNTER — Encounter (HOSPITAL_COMMUNITY): Payer: Self-pay

## 2019-01-15 ENCOUNTER — Ambulatory Visit (HOSPITAL_COMMUNITY): Payer: BC Managed Care – PPO

## 2019-03-06 IMAGING — MR MR LUMBAR SPINE W/O CM
4 of 5 series · 14 of 48 positions shown · non-contrast
Comparison: Lumbar spine radiograph 04/10/2017

CLINICAL DATA: Low back pain

EXAM:
MRI LUMBAR SPINE WITHOUT CONTRAST
TECHNIQUE: Multiplanar, multisequence MR imaging of the lumbar spine was
performed. No intravenous contrast was administered.

[Series 3: T2 · sagittal · 4.0mm · 0.74mm/px · 5 of 17 slices shown (1 of 2)]
[im 1/17]
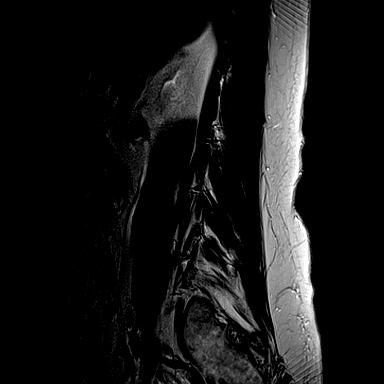
[im 5/17]
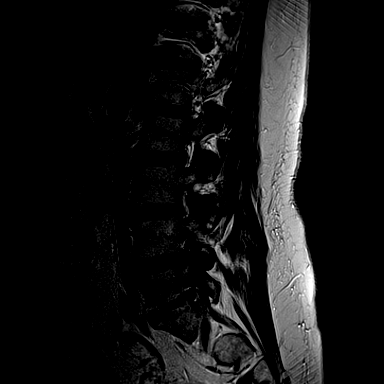
[im 9/17]
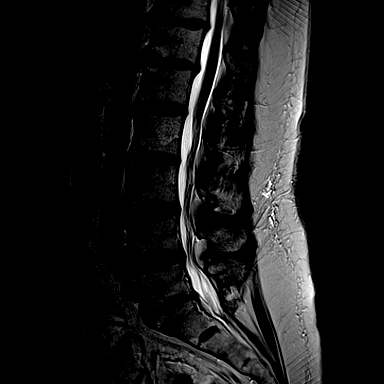
[im 13/17]
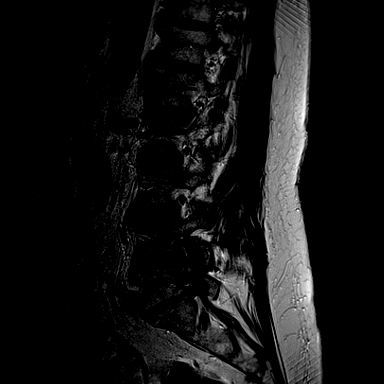
[im 17/17]
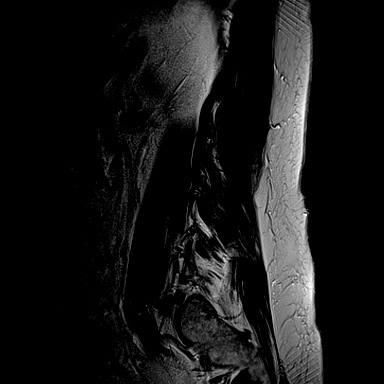

[Series 4: T1 · sagittal · 4.0mm · 0.37mm/px · 3 of 17 slices shown (1 of 2)]
[im 1/17]
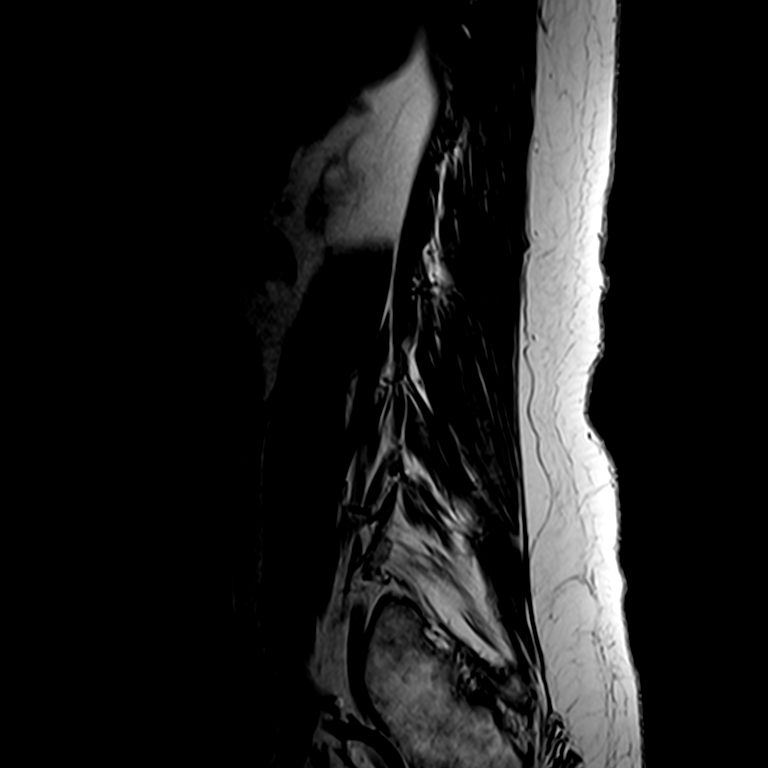
[im 9/17]
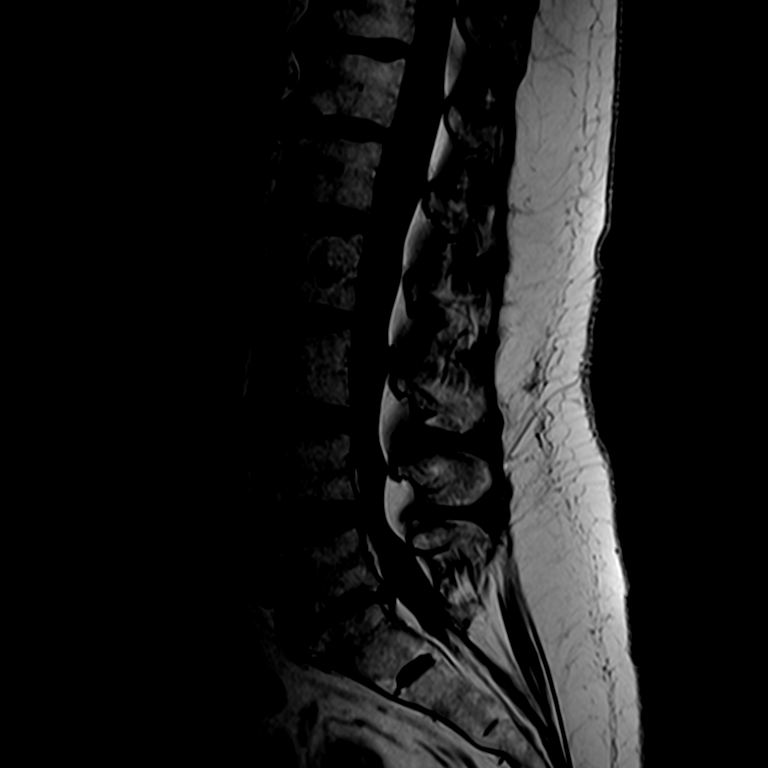
[im 17/17]
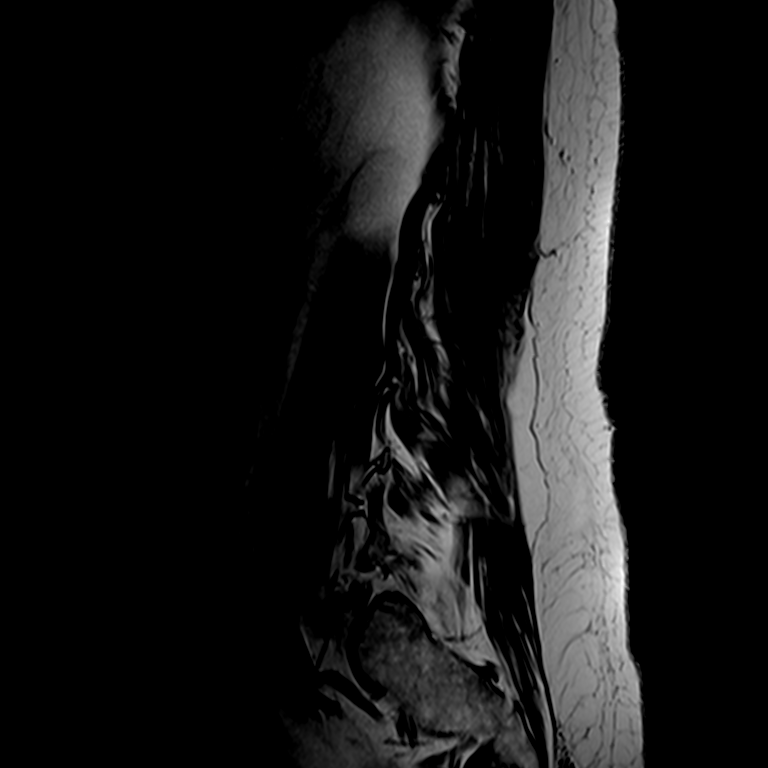

[Series 6: T2 · axial · 4.0mm · 0.22mm/px · z∈[-116,+47]mm · 3 of 47 slices shown (2 of 2)]
[im 7/47]
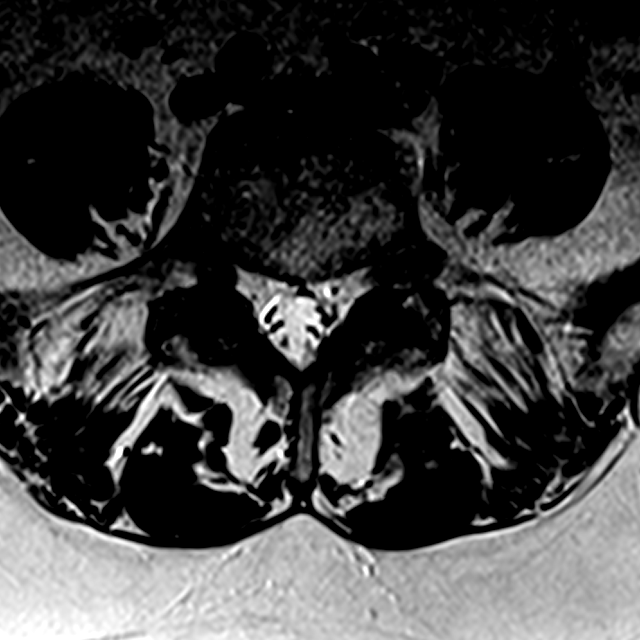
[im 25/47]
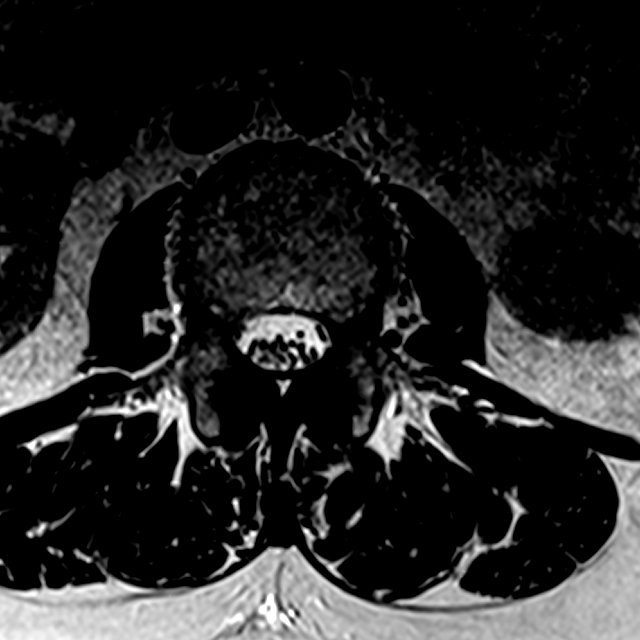
[im 40/47]
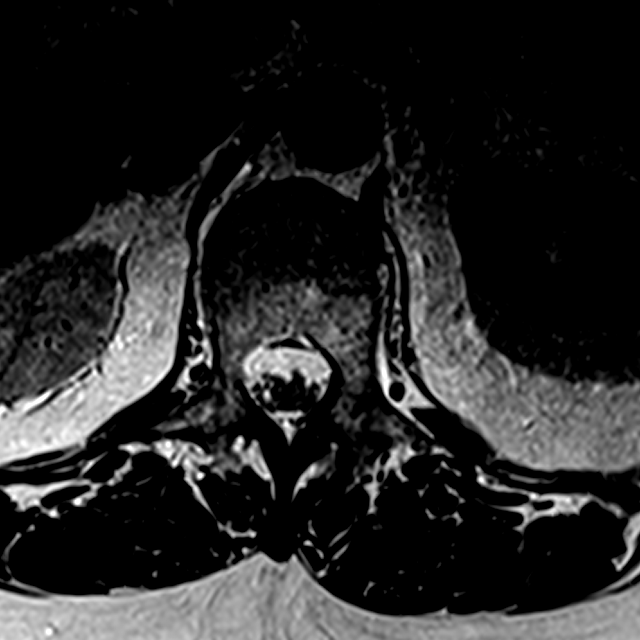

[Series 7: T1 · axial · 4.0mm · 0.23mm/px · z∈[-118,+47]mm · 3 of 47 slices shown (2 of 2)]
[im 7/47]
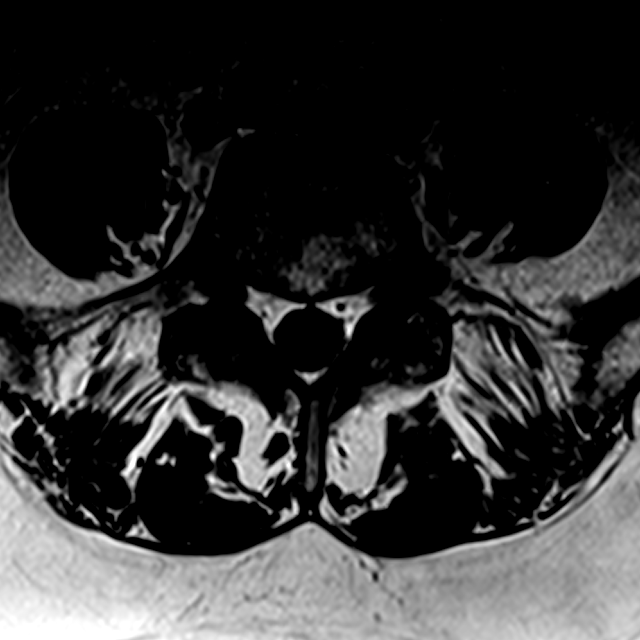
[im 25/47]
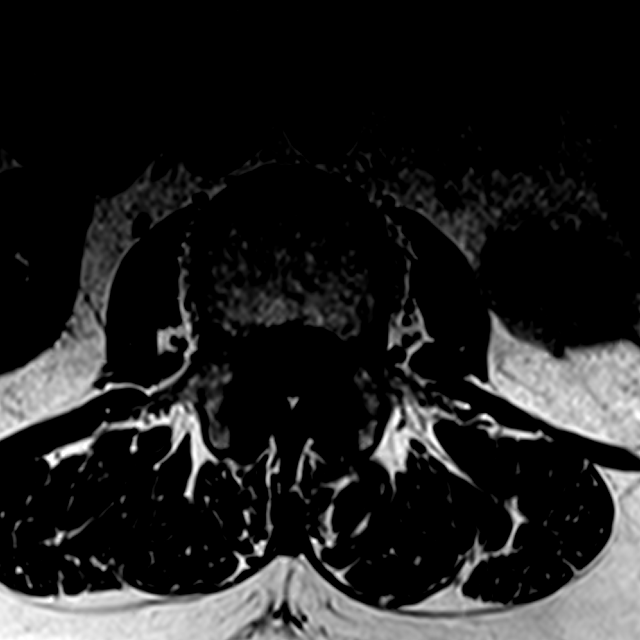
[im 40/47]
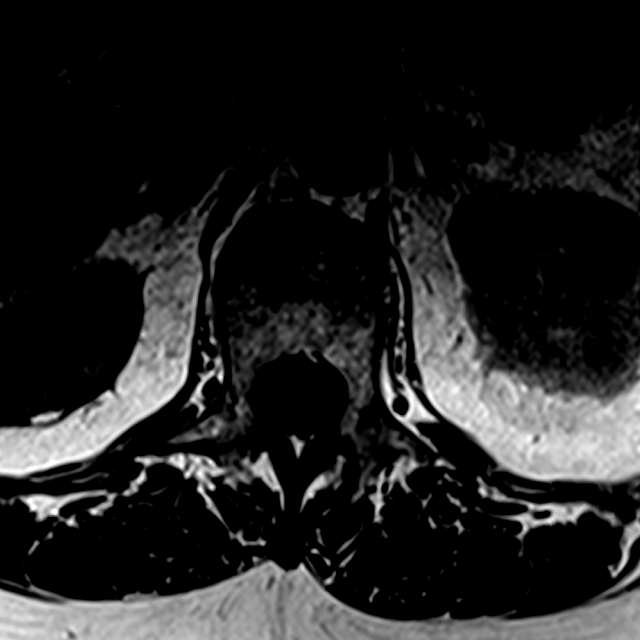

[14 of 48 positions shown; findings below may reference images not displayed]

FINDINGS: Segmentation:  Standard.

Alignment:  Physiologic.

Vertebrae: There is a mildly high T2-weighted signal, intermediate
T1-weighted signal lesion in the L2 vertebral body. No other focal
marrow signal abnormality.

Conus medullaris and cauda equina: Conus extends to the L1 level.
Conus and cauda equina appear normal.

Paraspinal and other soft tissues: Negative.

Disc levels:

T11-T12: Small disc herniation without stenosis.

T12-L1: Small disc bulge.  No stenosis.

L1-L2: Small left foraminal disc protrusion with mild left foraminal
stenosis. No spinal canal stenosis.

L2-L3: Small right foraminal/extraforaminal protrusion. No stenosis.

L3-L4: Disc desiccation with medium-sized diffuse bulge. No spinal
canal or neural foraminal stenosis. Small annular fissure at the
inferior foraminal aspect of the disc on the right.

L4-L5: Medium-sized diffuse disc bulge with moderate spinal canal
stenosis. Mild bilateral facet hypertrophy. Moderate bilateral
neural foraminal stenosis.

L5-S1: Disc space narrowing with diffuse mild bulge. No spinal canal
stenosis. Severe bilateral neural foraminal stenosis.

Visualized sacrum: Normal.
IMPRESSION: 1. Severe bilateral L5-S1 neural foraminal stenosis due to
combination of disc bulge and endplate osteophytes.
2. L4-L5 moderate bilateral neural foraminal stenosis.
3. L3-L4 right foraminal annular fissure without associated
stenosis.

## 2019-04-08 ENCOUNTER — Ambulatory Visit (HOSPITAL_COMMUNITY): Payer: BC Managed Care – PPO

## 2019-04-08 ENCOUNTER — Encounter (HOSPITAL_COMMUNITY): Payer: Self-pay

## 2019-05-02 IMAGING — CT CT CHEST LUNG CANCER SCREENING LOW DOSE W/O CM
1 of 2 series · 10 of 20 positions shown, 13 images · non-contrast
Comparison: No priors.

CLINICAL DATA: 56-year-old female current smoker with 42 pack year
history of smoking. Lung cancer screening examination.

EXAM:
CT CHEST WITHOUT CONTRAST LOW-DOSE FOR LUNG CANCER SCREENING
TECHNIQUE: Multidetector CT imaging of the chest was performed following the
standard protocol without IV contrast.

[ct lung segmentation data · axial · 0.62mm/px · z∈[-180,-180]mm · 10 of 324 frames shown]
[frame 1/324  mediastinal]
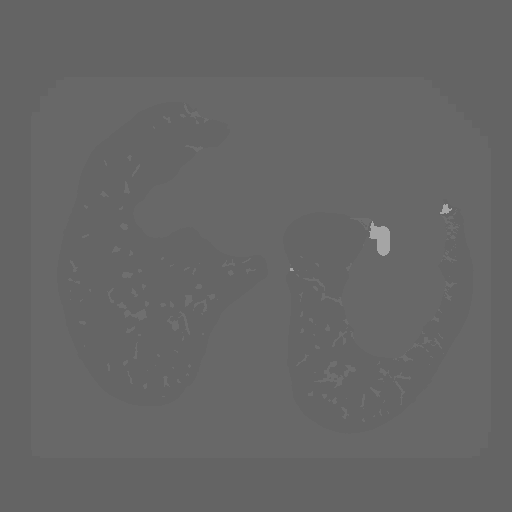
[frame 1/324  lung]
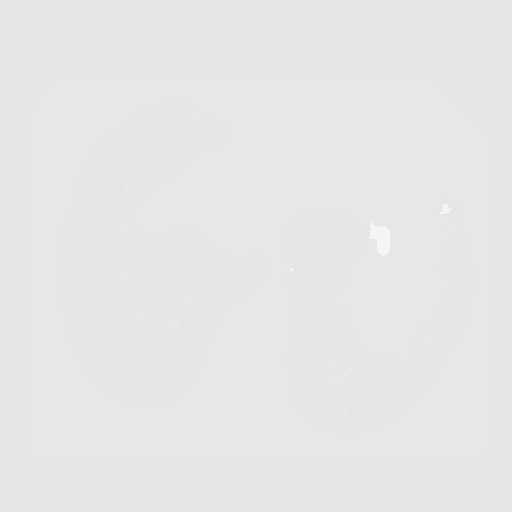
[frame 36/324  lung]
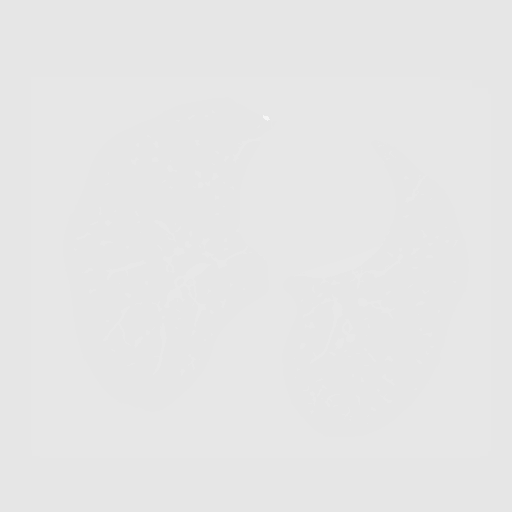
[frame 72/324  lung]
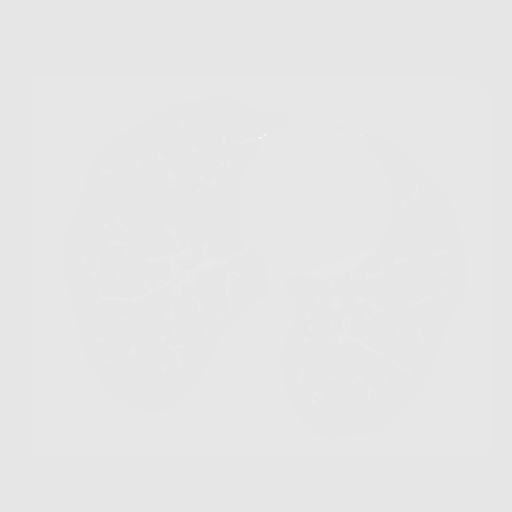
[frame 108/324  lung]
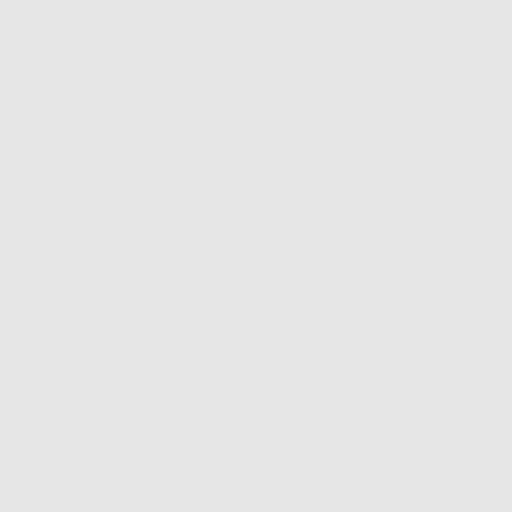
[frame 144/324  mediastinal]
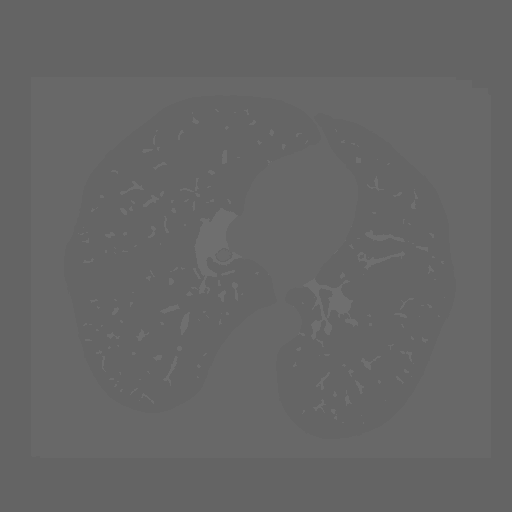
[frame 144/324  lung]
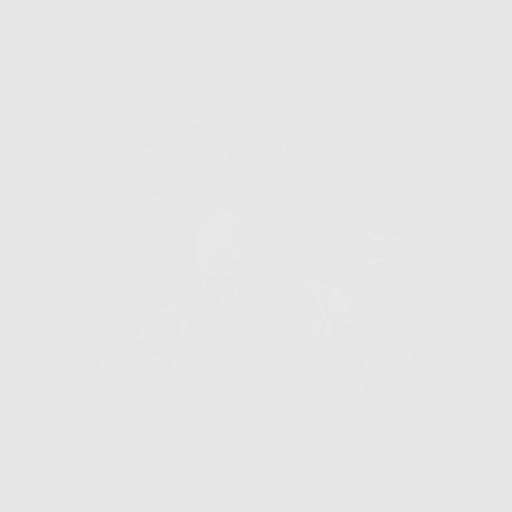
[frame 180/324  lung]
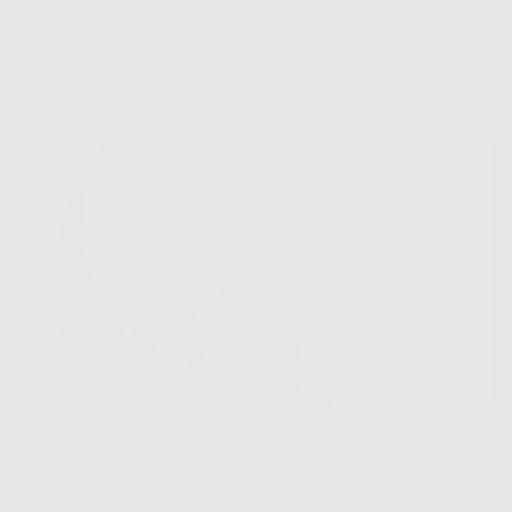
[frame 216/324  lung]
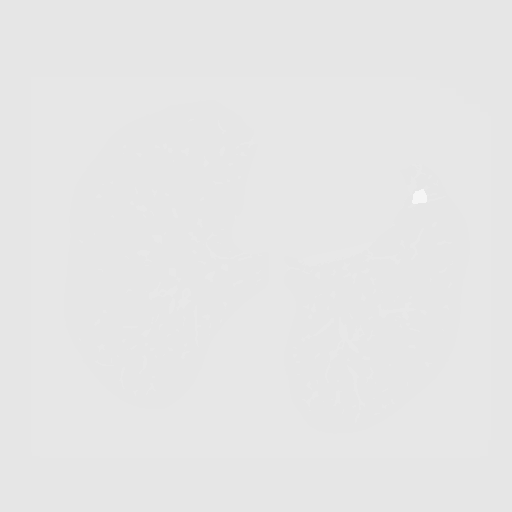
[frame 252/324  lung]
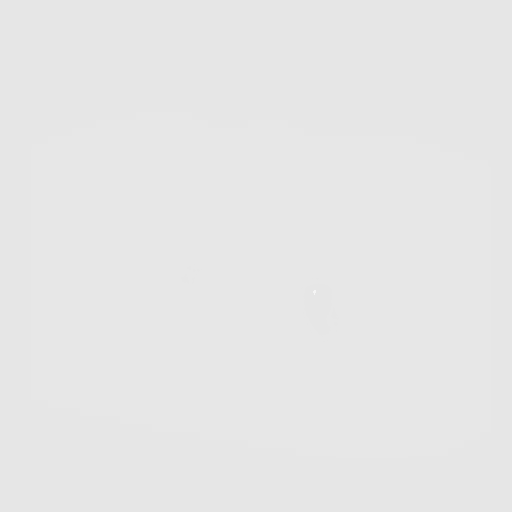
[frame 288/324  mediastinal]
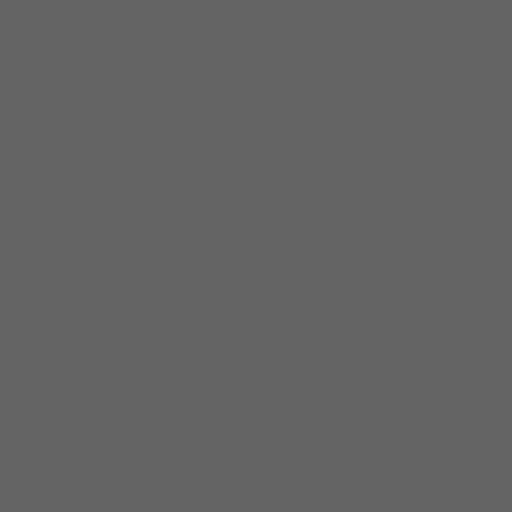
[frame 288/324  lung]
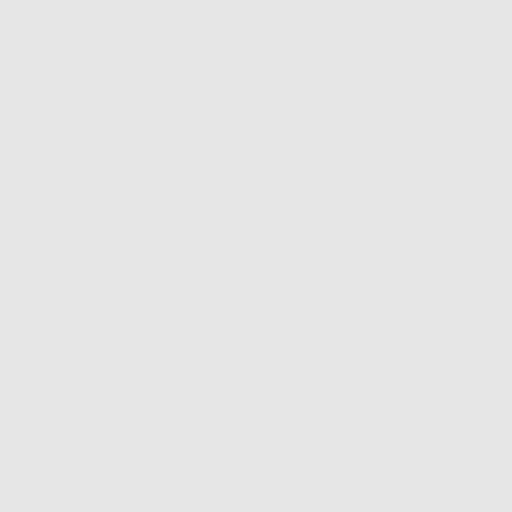
[frame 324/324  lung]
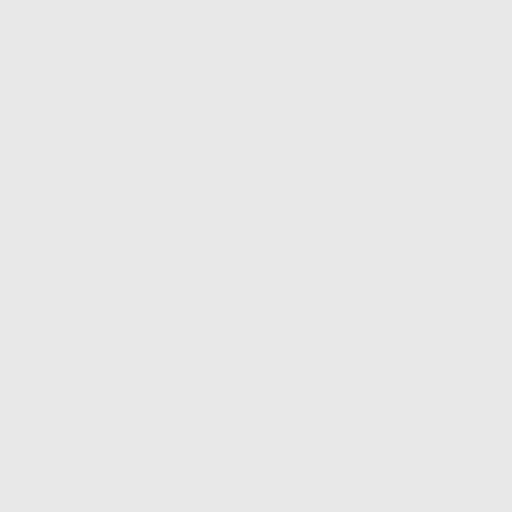

[10 of 20 positions shown; findings below may reference images not displayed]

FINDINGS: Cardiovascular: Heart size is normal. There is no significant
pericardial fluid, thickening or pericardial calcification. There is
aortic atherosclerosis, as well as atherosclerosis of the great
vessels of the mediastinum and the coronary arteries, including
calcified atherosclerotic plaque in the right coronary artery.

Mediastinum/Nodes: No pathologically enlarged mediastinal or hilar
lymph nodes. Please note that accurate exclusion of hilar adenopathy
is limited on noncontrast CT scans. Esophagus is unremarkable in
appearance. No axillary lymphadenopathy.

Lungs/Pleura: Several small pulmonary nodules are noted in the lungs
bilaterally, the largest of which is in the left lower lobe (axial
image 171 of series 3) with a volume derived mean diameter 5.4 mm.
No larger more suspicious appearing pulmonary nodules or masses are
noted. No acute consolidative airspace disease. No pleural
effusions. Mild diffuse bronchial wall thickening with mild
centrilobular and paraseptal emphysema.

Upper Abdomen: Aortic atherosclerosis.

Musculoskeletal: There are no aggressive appearing lytic or blastic
lesions noted in the visualized portions of the skeleton. Orthopedic
fixation hardware in the lower cervical spine incompletely imaged.
IMPRESSION: 1. Lung-RADS 2S, benign appearance or behavior. Continue annual
screening with low-dose chest CT without contrast in 12 months.
2. The "S" modifier above refers to potentially clinically
significant non lung cancer related findings. Specifically, there is
aortic atherosclerosis, in addition to right coronary artery
disease. Please note that although the presence of coronary artery
calcium documents the presence of coronary artery disease, the
severity of this disease and any potential stenosis cannot be
assessed on this non-gated CT examination. Assessment for potential
risk factor modification, dietary therapy or pharmacologic therapy
may be warranted, if clinically indicated.
3. Mild diffuse bronchial thickening with mild centrilobular and
paraseptal emphysema; imaging findings suggestive of underlying
COPD.

Aortic Atherosclerosis (9FTNK-55Z.Z) and Emphysema (9FTNK-VV1.9).

## 2019-06-09 DIAGNOSIS — R5383 Other fatigue: Secondary | ICD-10-CM | POA: Diagnosis not present

## 2019-06-09 DIAGNOSIS — J449 Chronic obstructive pulmonary disease, unspecified: Secondary | ICD-10-CM | POA: Diagnosis not present

## 2019-06-09 DIAGNOSIS — M79671 Pain in right foot: Secondary | ICD-10-CM | POA: Diagnosis not present

## 2019-06-09 DIAGNOSIS — G4700 Insomnia, unspecified: Secondary | ICD-10-CM | POA: Diagnosis not present

## 2019-06-11 ENCOUNTER — Other Ambulatory Visit (HOSPITAL_COMMUNITY): Payer: Self-pay | Admitting: Respiratory Therapy

## 2019-06-11 DIAGNOSIS — J441 Chronic obstructive pulmonary disease with (acute) exacerbation: Secondary | ICD-10-CM

## 2019-06-23 DIAGNOSIS — J449 Chronic obstructive pulmonary disease, unspecified: Secondary | ICD-10-CM | POA: Diagnosis not present

## 2019-06-23 DIAGNOSIS — Z87891 Personal history of nicotine dependence: Secondary | ICD-10-CM | POA: Diagnosis not present

## 2019-06-23 DIAGNOSIS — F17210 Nicotine dependence, cigarettes, uncomplicated: Secondary | ICD-10-CM | POA: Diagnosis not present

## 2019-06-24 DIAGNOSIS — D224 Melanocytic nevi of scalp and neck: Secondary | ICD-10-CM | POA: Diagnosis not present

## 2019-06-24 DIAGNOSIS — D2239 Melanocytic nevi of other parts of face: Secondary | ICD-10-CM | POA: Diagnosis not present

## 2019-06-24 DIAGNOSIS — D2271 Melanocytic nevi of right lower limb, including hip: Secondary | ICD-10-CM | POA: Diagnosis not present

## 2019-06-24 DIAGNOSIS — B078 Other viral warts: Secondary | ICD-10-CM | POA: Diagnosis not present

## 2019-06-24 DIAGNOSIS — L918 Other hypertrophic disorders of the skin: Secondary | ICD-10-CM | POA: Diagnosis not present

## 2019-06-30 ENCOUNTER — Other Ambulatory Visit (HOSPITAL_COMMUNITY): Payer: Self-pay | Admitting: Internal Medicine

## 2019-06-30 ENCOUNTER — Other Ambulatory Visit: Payer: Self-pay | Admitting: Internal Medicine

## 2019-06-30 DIAGNOSIS — R911 Solitary pulmonary nodule: Secondary | ICD-10-CM

## 2019-07-08 ENCOUNTER — Ambulatory Visit (INDEPENDENT_AMBULATORY_CARE_PROVIDER_SITE_OTHER): Payer: BC Managed Care – PPO

## 2019-07-08 ENCOUNTER — Encounter: Payer: Self-pay | Admitting: Podiatry

## 2019-07-08 ENCOUNTER — Ambulatory Visit (INDEPENDENT_AMBULATORY_CARE_PROVIDER_SITE_OTHER): Payer: BC Managed Care – PPO | Admitting: Podiatry

## 2019-07-08 ENCOUNTER — Other Ambulatory Visit: Payer: Self-pay

## 2019-07-08 DIAGNOSIS — M778 Other enthesopathies, not elsewhere classified: Secondary | ICD-10-CM | POA: Diagnosis not present

## 2019-07-08 DIAGNOSIS — M7671 Peroneal tendinitis, right leg: Secondary | ICD-10-CM | POA: Diagnosis not present

## 2019-07-08 DIAGNOSIS — B351 Tinea unguium: Secondary | ICD-10-CM | POA: Diagnosis not present

## 2019-07-08 NOTE — Progress Notes (Signed)
Subjective:   Patient ID: Natasha [name], female   DOB: 59 y.o.   MRN: [id number]   HPI Patient presents stating having a lot of pain in the outside of the right foot for the last 3 months and also concerned about nail discoloration fifth nails big toenails bilateral.  States she does not remember injury but it is been very sore and patient smokes pack a day and likes to be active if possible   Review of Systems  All other systems reviewed and are negative.       Objective:  Physical Exam Vitals and nursing note reviewed.  Constitutional:      Appearance: She is well-developed.  Pulmonary:     Effort: Pulmonary effort is normal.  Musculoskeletal:        General: Normal range of motion.  Skin:    General: Skin is warm.  Neurological:     Mental Status: She is alert.     Neurovascular status intact muscle strength adequate range of motion within normal limits.  Patient is found to have exquisite discomfort lateral aspect right foot peroneal insertion into the fifth metatarsal with inflammation fluid around the area.  Patient is found to have good digital perfusion well oriented x3     Assessment:  Peroneal tendinitis right inflammation fluid noted along with traumatized nailbeds with possible mycotic component bilateral     Plan:  H&P reviewed conditions will get a focus on the outside the right foot I did sterile prep injected the tendon insertion base of fifth metatarsal 3 mg Dexasone Kenalog 5 mg Xylocaine and applied fascial brace to lift up the lateral side of the foot along with aggressive ice.  Start on topical for the fungal infection and do not recommend anything more aggressive currently  X-rays indicate no signs of bony issue appears to be strictly soft tissue

## 2019-07-09 ENCOUNTER — Encounter (HOSPITAL_COMMUNITY): Payer: BC Managed Care – PPO

## 2019-07-14 ENCOUNTER — Telehealth: Payer: Self-pay | Admitting: *Deleted

## 2019-07-14 NOTE — Telephone Encounter (Signed)
Pt called states she saw Dr. Charlsie Merles 1 week ago and was given a CoreFlex plantar fascial brace, but she was told she has tendonitis not plantar fasciitis, and the assistant put the brace on with the CoreFlex decal on the front but the instructions state the decal goes to the back.

## 2019-07-14 NOTE — Telephone Encounter (Signed)
I spoke with pt and told her the decal should go in the back as per the instructions, but in some cases pts complain of a discomfort across the top of the foot. I told pt to further get a good fit with the brace, take the bottom elastic velcro strap off everytime the brace is removed, so the felt band could be place snuggly around the ankle and the apply the elastic strap to the opposite side of the painful area then pull up the problem side, the brace is to support the structures of the foot. Pt states understanding.

## 2019-07-22 ENCOUNTER — Ambulatory Visit: Payer: BC Managed Care – PPO | Admitting: Podiatry

## 2019-07-29 ENCOUNTER — Ambulatory Visit: Payer: BC Managed Care – PPO | Admitting: Podiatry

## 2019-08-12 ENCOUNTER — Other Ambulatory Visit: Payer: Self-pay

## 2019-08-12 ENCOUNTER — Ambulatory Visit (INDEPENDENT_AMBULATORY_CARE_PROVIDER_SITE_OTHER): Payer: BC Managed Care – PPO | Admitting: Podiatry

## 2019-08-12 ENCOUNTER — Encounter: Payer: Self-pay | Admitting: Podiatry

## 2019-08-12 VITALS — Temp 96.6°F

## 2019-08-12 DIAGNOSIS — T148XXA Other injury of unspecified body region, initial encounter: Secondary | ICD-10-CM

## 2019-08-12 DIAGNOSIS — M7671 Peroneal tendinitis, right leg: Secondary | ICD-10-CM | POA: Diagnosis not present

## 2019-08-12 NOTE — Progress Notes (Signed)
Subjective:   Patient ID: Natasha [name], female   DOB: 59 y.o.   MRN: [id number]   HPI Patient states I am getting some swelling in the outside of my right foot and states that it is been going on for a while and the brace has helped some but it still sore and only got better for short period after the injection    ROS      Objective:  Physical Exam  Neurovascular status intact with continued feet fusiform swelling of the outside of the right foot at the peroneal tendon close to its insertion into the base of the fifth metatarsal     Assessment:  Possibility for a tear of the peroneal tendon right     Plan:  HEP educated her on this and I want her to wear her boot and she will order one from [place] and then I want her to use ice therapy on this and I did discuss if it does not get better we will get a need to consider MRI or possible exploratory surgery with repair of tendon.  Reappoint 4 weeks or earlier if needed

## 2019-08-14 ENCOUNTER — Telehealth: Payer: Self-pay | Admitting: Podiatry

## 2019-08-14 NOTE — Telephone Encounter (Signed)
Pt left message stating she seen Dr Charlsie Merles and he told her to get a walking boot and she looked on [place] but there are so many different kinds. She wants more information to make sure she is getting the correct one.   I discuss with Olegario Messier and she said to tell her to get a short air fracture walker.   I left message for pt with this information and to call on Monday if any further questions.

## 2019-09-04 ENCOUNTER — Other Ambulatory Visit: Payer: Self-pay

## 2019-09-04 ENCOUNTER — Other Ambulatory Visit (HOSPITAL_COMMUNITY)
Admission: RE | Admit: 2019-09-04 | Discharge: 2019-09-04 | Disposition: A | Discharge status: Home / Self Care | Payer: BC Managed Care – PPO | Source: Ambulatory Visit | Attending: Internal Medicine | Admitting: Internal Medicine

## 2019-09-04 ENCOUNTER — Other Ambulatory Visit (HOSPITAL_COMMUNITY): Payer: BC Managed Care – PPO

## 2019-09-08 ENCOUNTER — Ambulatory Visit (HOSPITAL_COMMUNITY): Payer: BC Managed Care – PPO

## 2019-09-10 ENCOUNTER — Encounter: Payer: Self-pay | Admitting: Podiatry

## 2019-09-10 ENCOUNTER — Telehealth: Payer: Self-pay | Admitting: Podiatry

## 2019-09-10 ENCOUNTER — Other Ambulatory Visit: Payer: Self-pay

## 2019-09-10 ENCOUNTER — Ambulatory Visit (INDEPENDENT_AMBULATORY_CARE_PROVIDER_SITE_OTHER): Payer: BC Managed Care – PPO | Admitting: Podiatry

## 2019-09-10 VITALS — Temp 97.6°F

## 2019-09-10 DIAGNOSIS — M7671 Peroneal tendinitis, right leg: Secondary | ICD-10-CM | POA: Diagnosis not present

## 2019-09-10 DIAGNOSIS — T148XXA Other injury of unspecified body region, initial encounter: Secondary | ICD-10-CM

## 2019-09-10 NOTE — Telephone Encounter (Signed)
Patient would like to have MRI done at [facility], if possible.

## 2019-09-11 ENCOUNTER — Telehealth: Payer: Self-pay | Admitting: *Deleted

## 2019-09-11 DIAGNOSIS — T148XXA Other injury of unspecified body region, initial encounter: Secondary | ICD-10-CM

## 2019-09-11 DIAGNOSIS — M7671 Peroneal tendinitis, right leg: Secondary | ICD-10-CM

## 2019-09-11 NOTE — Telephone Encounter (Signed)
Orders to K. Mavrakis, CMA for pre-cert, faxed to [facility].

## 2019-09-11 NOTE — Progress Notes (Signed)
Subjective:   Patient ID: Natasha [name], female   DOB: 59 y.o.   MRN: [id number]   HPI Patient presents stating the pain in the outside of the right ankle seems to be getting worse and she is having more problems with ambulation.  Patient states that she is tried ice therapy she has her boot and the pain continues   ROS      Objective:  Physical Exam  Neurovascular status intact muscle strength adequate range of motion within normal limits.  Patient is found to have exquisite discomfort lateral side right foot with edema around the peroneal as it comes under the ankle inserts into the base of fifth metatarsal.  Quite sore in this area and has been going on for a while and patient did have history of injury to the area     Assessment:  With failure to respond possibility that we may be dealing with a interstitial tear of the peroneal tendon     Plan:  With continued discomfort worsening of symptoms I recommended MRI to rule out a tear of the peroneal tendon.  Patient wants to have this done is scheduled for this will continue boot usage ice therapy we will get results with patient understanding the chance for surgical intervention in this particular case

## 2019-09-16 ENCOUNTER — Telehealth: Payer: Self-pay | Admitting: *Deleted

## 2019-09-16 NOTE — Telephone Encounter (Signed)
Faxed [employer] - Reviewer Nurse clinicals of 07/2019 to 09/10/2019 with orders and demographics for appeal of denial for MRI Right ankle without contrast.

## 2019-09-16 NOTE — Telephone Encounter (Signed)
Valery:  I was denied request for MRI of Right Ankle w/o contrast for patient.  I was told to fax over clinical information and do a cover sheet to:  Attn: Nurse Reviewer Members name, DOB and ID # Health plan = [employer] [facility]  Reference #: Laurel Dimmer (this is who I spoke with) on 09/16/19 at 11:50 am. They said that was the reference number since I have to fax over more information to the nurse reviewer.  Thank you,  Hannah Beat, CMA (AAMA)

## 2019-09-16 NOTE — Telephone Encounter (Signed)
I call patient's insurance; [employer] [facility] to try to get a prior authorization for patient to get an MRI of Right Ankle w/o contrast per Dr. Beverlee Nims request.  Reference #: Laurel Dimmer (who I spoke with) on 09/16/19 at 11:50 am.  I need to fax over clinical information to:  [phone] (Fax #)  Attn:  Nurse Reviewer DOB of patient Members name and ID # (on insurance card) Health Plan ([employer])  Pt's insurance denied request.

## 2019-09-18 ENCOUNTER — Other Ambulatory Visit: Payer: Self-pay

## 2019-09-18 ENCOUNTER — Other Ambulatory Visit (HOSPITAL_COMMUNITY)
Admission: RE | Admit: 2019-09-18 | Discharge: 2019-09-18 | Disposition: A | Discharge status: Home / Self Care | Payer: BC Managed Care – PPO | Source: Ambulatory Visit | Attending: Internal Medicine | Admitting: Internal Medicine

## 2019-09-18 DIAGNOSIS — Z01812 Encounter for preprocedural laboratory examination: Secondary | ICD-10-CM | POA: Diagnosis not present

## 2019-09-18 DIAGNOSIS — Z20822 Contact with and (suspected) exposure to covid-19: Secondary | ICD-10-CM | POA: Diagnosis not present

## 2019-09-19 LAB — SARS CORONAVIRUS 2 (TAT 6-24 HRS): SARS Coronavirus 2: NEGATIVE

## 2019-09-21 ENCOUNTER — Telehealth: Payer: Self-pay | Admitting: Podiatry

## 2019-09-21 DIAGNOSIS — T148XXA Other injury of unspecified body region, initial encounter: Secondary | ICD-10-CM

## 2019-09-21 DIAGNOSIS — M7671 Peroneal tendinitis, right leg: Secondary | ICD-10-CM

## 2019-09-21 NOTE — Telephone Encounter (Signed)
Left message informing pt the MRI had been reordered with [facility] as requested and she could schedule with them at her convenience.

## 2019-09-21 NOTE — Telephone Encounter (Signed)
[employer] - JAN CHANGED MRI RIGHT ANKLE WITHOUT CONTRAST [phone] LOCATION TO [facility] NPI:  [id number], AUTHORIZATION:  [id number]. Faxed to [facility].

## 2019-09-21 NOTE — Telephone Encounter (Signed)
Patient would like to have a MRI order faxed over to [facility]. She has called around to see who was the least expensive.

## 2019-09-21 NOTE — Telephone Encounter (Signed)
Pt states it is not her ankle that hurts but about midfoot.

## 2019-09-21 NOTE — Telephone Encounter (Signed)
I informed pt the MRI Ankle would cover the ankle and almost up to the toes.

## 2019-09-21 NOTE — Telephone Encounter (Signed)
Cancelled MRI ordered with [facility].

## 2019-09-21 NOTE — Addendum Note (Signed)
Addended by: Alphia Kava D on: 09/21/2019 04:08 PM   Modules accepted: Orders

## 2019-09-22 ENCOUNTER — Ambulatory Visit (HOSPITAL_COMMUNITY)
Admission: RE | Admit: 2019-09-22 | Discharge: 2019-09-22 | Disposition: A | Discharge status: Home / Self Care | Payer: BC Managed Care – PPO | Source: Ambulatory Visit | Attending: Internal Medicine | Admitting: Internal Medicine

## 2019-09-22 ENCOUNTER — Other Ambulatory Visit: Payer: Self-pay

## 2019-09-22 DIAGNOSIS — J441 Chronic obstructive pulmonary disease with (acute) exacerbation: Secondary | ICD-10-CM | POA: Diagnosis not present

## 2019-09-22 LAB — PULMONARY FUNCTION TEST
DL/VA % pred: 75 %
DL/VA: 3.03 ml/min/mmHg/L
DLCO unc % pred: 60 %
DLCO unc: 14.84 ml/min/mmHg
FEF 25-75 Post: 0.68 L/sec
FEF 25-75 Pre: 0.45 L/sec
FEF2575-%Change-Post: 50 %
FEF2575-%Pred-Post: 24 %
FEF2575-%Pred-Pre: 16 %
FEV1-%Change-Post: 16 %
FEV1-%Pred-Post: 49 %
FEV1-%Pred-Pre: 42 %
FEV1-Post: 1.57 L
FEV1-Pre: 1.35 L
FEV1FVC-%Change-Post: 7 %
FEV1FVC-%Pred-Pre: 60 %
FEV6-%Change-Post: 10 %
FEV6-%Pred-Post: 68 %
FEV6-%Pred-Pre: 61 %
FEV6-Post: 2.71 L
FEV6-Pre: 2.46 L
FEV6FVC-%Change-Post: 2 %
FEV6FVC-%Pred-Post: 91 %
FEV6FVC-%Pred-Pre: 89 %
FVC-%Change-Post: 8 %
FVC-%Pred-Post: 74 %
FVC-%Pred-Pre: 69 %
FVC-Post: 3.07 L
FVC-Pre: 2.84 L
Post FEV1/FVC ratio: 51 %
Post FEV6/FVC ratio: 88 %
Pre FEV1/FVC ratio: 47 %
Pre FEV6/FVC Ratio: 87 %
RV % pred: 172 %
RV: 3.91 L
TLC % pred: 115 %
TLC: 6.88 L

## 2019-09-22 MED ORDER — ALBUTEROL SULFATE (2.5 MG/3ML) 0.083% IN NEBU
2.5000 mg | INHALATION_SOLUTION | Freq: Once | RESPIRATORY_TRACT | Status: AC
  Administered 2019-09-22: 2.5 mg via RESPIRATORY_TRACT

## 2019-09-22 NOTE — Telephone Encounter (Signed)
Should be ok. I would just make sure the ankle mri extends to the midfoot

## 2019-09-22 NOTE — Telephone Encounter (Signed)
Done today.

## 2019-09-23 ENCOUNTER — Ambulatory Visit (HOSPITAL_COMMUNITY): Payer: BC Managed Care – PPO

## 2019-09-24 DIAGNOSIS — Z23 Encounter for immunization: Secondary | ICD-10-CM | POA: Diagnosis not present

## 2019-09-25 ENCOUNTER — Encounter (HOSPITAL_COMMUNITY): Payer: Self-pay

## 2019-09-25 ENCOUNTER — Ambulatory Visit (HOSPITAL_COMMUNITY): Payer: BC Managed Care – PPO

## 2019-10-05 DIAGNOSIS — Z87828 Personal history of other (healed) physical injury and trauma: Secondary | ICD-10-CM | POA: Diagnosis not present

## 2019-10-05 DIAGNOSIS — M25571 Pain in right ankle and joints of right foot: Secondary | ICD-10-CM | POA: Diagnosis not present

## 2019-10-14 ENCOUNTER — Telehealth: Payer: Self-pay | Admitting: *Deleted

## 2019-10-14 NOTE — Telephone Encounter (Signed)
Left a voicemail message for patient at [phone] (cell #) to let them know their MRI Results. They have a peroneal tear and that Dr. Charlsie Merles is referring them to Dr. Logan Bores for further evaluation.  I will have the schedulers call patient to get on Dr. Logan Bores schedule.

## 2019-10-19 ENCOUNTER — Ambulatory Visit (INDEPENDENT_AMBULATORY_CARE_PROVIDER_SITE_OTHER): Payer: BC Managed Care – PPO | Admitting: Podiatry

## 2019-10-19 ENCOUNTER — Other Ambulatory Visit: Payer: Self-pay

## 2019-10-19 DIAGNOSIS — T148XXA Other injury of unspecified body region, initial encounter: Secondary | ICD-10-CM

## 2019-10-19 DIAGNOSIS — M7671 Peroneal tendinitis, right leg: Secondary | ICD-10-CM | POA: Diagnosis not present

## 2019-10-19 MED ORDER — MELOXICAM 15 MG PO TABS: 15.0000 mg | ORAL_TABLET | Freq: Every day | ORAL | 1 refills | Status: DC

## 2019-10-19 MED ORDER — METHYLPREDNISOLONE 4 MG PO TBPK: ORAL_TABLET | ORAL | 0 refills | Status: DC

## 2019-10-21 DIAGNOSIS — Z23 Encounter for immunization: Secondary | ICD-10-CM | POA: Diagnosis not present

## 2019-10-21 NOTE — Progress Notes (Signed)
   HPI: 59 y.o. female presenting today for follow up evaluation of lateral right foot pain. She reports continued pain that is relatively unchanged since her last appointment last month. She has been using the CAM boot, plantar fascial brace and ice therapy for treatment. Walking increases the pain. She had an MRI done on 10/06/2019 and is here to get the results. Patient is here for further evaluation and treatment.   Past Medical History:  Diagnosis Date  . Arthritis   . COPD (chronic obstructive pulmonary disease) (HCC)   . DDD (degenerative disc disease)   . DDD (degenerative disc disease), lumbosacral   . Depression   . Hemorrhoids   . Hyperlipidemia   . Hypertension   . Vitamin D deficiency      Physical Exam: General: The patient is alert and oriented x3 in no acute distress.  Dermatology: Skin is warm, dry and supple bilateral lower extremities. Negative for open lesions or macerations.  Vascular: Palpable pedal pulses bilaterally. No edema or erythema noted. Capillary refill within normal limits.  Neurological: Epicritic and protective threshold grossly intact bilaterally.   Musculoskeletal Exam: Pain with palpation to the insertion of the peroneal tendon of the right foot. Range of motion within normal limits to all pedal and ankle joints bilateral. Muscle strength 5/5 in all groups bilateral.   MRI Impression from [facility] done on 10/06/2019:  1. Suspect longitudinal tear of the price brevis tendon.  2. Old high-grade ATFL sprain.   Assessment: 1. Insertional peroneal tendinitis right 2. Longitudinal split tear price brevis right    Plan of Care:  1. Patient evaluated. MRI reviewed.  2. Recommended weightbearing in CAM boot for three weeks.  3. Prescription for Medrol Dose Pak provided to patient. 4. Prescription for Meloxicam provided to patient. 5. Patient does not want surgery at this time.  6. Return to clinic in 4 weeks.      Felecia Shelling, DPM [facility]  Dr. Felecia Shelling, DPM    [address] [place], [place] [address]                Office [phone]  Fax [phone]

## 2019-10-30 ENCOUNTER — Other Ambulatory Visit: Payer: Self-pay

## 2019-10-30 ENCOUNTER — Encounter: Payer: Self-pay | Admitting: Emergency Medicine

## 2019-10-30 ENCOUNTER — Ambulatory Visit (INDEPENDENT_AMBULATORY_CARE_PROVIDER_SITE_OTHER): Payer: BC Managed Care – PPO | Admitting: Emergency Medicine

## 2019-10-30 VITALS — BP 120/76 | HR 81 | Temp 98.7°F | Ht 70.0 in | Wt 192.2 lb

## 2019-10-30 DIAGNOSIS — J449 Chronic obstructive pulmonary disease, unspecified: Secondary | ICD-10-CM | POA: Diagnosis not present

## 2019-10-30 DIAGNOSIS — Z720 Tobacco use: Secondary | ICD-10-CM

## 2019-10-30 MED ORDER — STIOLTO RESPIMAT 2.5-2.5 MCG/ACT IN AERS
2.0000 | INHALATION_SPRAY | Freq: Every day | RESPIRATORY_TRACT | 0 refills | Status: DC
Start: 2019-10-30 — End: 2019-11-24

## 2019-10-30 NOTE — Progress Notes (Signed)
Subjective:    Patient ID: Natasha [name], female    DOB: [birth year], 59 y.o.   MRN: [id number]  HPI 59 year old occasional smoker (60 pack years) with a history of hyperlipidemia, hypertension.  She also carries a history of COPD that was made by Dr Luan Pulling several yrs ago  She is currently managed on Spiriva Respimat.  She also has albuterol which she uses approximately daily.  She describes an improvement in her cough after she stopped smoking in 07/2018. Has not really seen any improvement in her breathing since then. She does believe that the spiriva helps her. She does not cough much, minimal mucous. Occasional wheeze. COVID vaccine up to date. She has had AE's before but not for several years.   She underwent pulmonary function testing on 08/2019 which I have reviewed, shows severe obstruction with a positive bronchodilator response.  Lung volumes are hyperinflated.  Diffusion capacity is decreased and does not fully correct when adjusted for alveolar volume.  Last LDCT was 60/23/2020 at [facility] > was read as RADS 3, recommended f/u scan in 6 months   Review of Systems As per HPI  Past Medical History:  Diagnosis Date  . Arthritis   . COPD (chronic obstructive pulmonary disease) ([place])   . DDD (degenerative disc disease)   . DDD (degenerative disc disease), lumbosacral   . Depression   . Hemorrhoids   . Hyperlipidemia   . Hypertension   . Vitamin D deficiency      Family History  Problem Relation Age of Onset  . COPD Mother   . Stroke Mother   . Hypertension Mother   . Hyperlipidemia Mother   . Depression Mother   . Hypertension Father   . Diabetes Father   . Cancer Father   . Hyperlipidemia Father   . Alcohol abuse Father   . Hyperlipidemia Brother   . Cancer Maternal Grandmother   . Stroke Paternal Grandmother   . Diabetes Other   . Colitis Other      Social History   Socioeconomic History  . Marital status: Single    Spouse name: Not on file  . Number of  children: 0  . Years of education: Not on file  . Highest education level: Not on file  Occupational History  . Occupation: accounting  Tobacco Use  . Smoking status: Former Smoker    Packs/day: 1.00    Years: 35.00    Pack years: 35.00    Types: Cigarettes    Quit date: 08/01/2018    Years since quitting: 1.2  . Smokeless tobacco: Never Used  . Tobacco comment: takes a puff from time to time  Substance and Sexual Activity  . Alcohol use: Yes    Comment: several times weekly  . Drug use: No  . Sexual activity: Yes    Birth control/protection: Post-menopausal  Other Topics Concern  . Not on file  Social History Narrative   Epworth Sleepiness Scale = 9 (as of 08/10/2015)   Social Determinants of Health   Financial Resource Strain:   . Difficulty of Paying Living Expenses:   Food Insecurity:   . Worried About Charity fundraiser in the Last Year:   . Arboriculturist in the Last Year:   Transportation Needs:   . Film/video editor (Medical):   Marland Kitchen Lack of Transportation (Non-Medical):   Physical Activity:   . Days of Exercise per Week:   . Minutes of Exercise per Session:   Stress:   .  Feeling of Stress :   Social Connections:   . Frequency of Communication with Friends and Family:   . Frequency of Social Gatherings with Friends and Family:   . Attends Religious Services:   . Active Member of Clubs or Organizations:   . Attends Banker Meetings:   Marland Kitchen Marital Status:   Intimate Partner Violence:   . Fear of Current or Ex-Partner:   . Emotionally Abused:   Marland Kitchen Physically Abused:   . Sexually Abused:      No Known Allergies   Outpatient Medications Prior to Visit  Medication Sig Dispense Refill  . albuterol (PROVENTIL HFA;VENTOLIN HFA) 108 (90 Base) MCG/ACT inhaler Inhale 2 puffs into the lungs every 4 (four) hours as needed for wheezing or shortness of breath.    . ALPRAZolam (XANAX) 0.25 MG tablet Take 0.25 mg by mouth daily as needed.    Marland Kitchen BIOTIN PO  Take by mouth daily.    . bisoprolol-hydrochlorothiazide (ZIAC) 10-6.25 MG tablet Take 1 tablet by mouth daily.    . [place]-Glucosamine-Vit D (GLUCOSAMINE COMPLEX PO) Take 1 tablet by mouth daily.    Marland Kitchen buPROPion (WELLBUTRIN SR) 150 MG 12 hr tablet Take 150 mg by mouth 2 (two) times daily.    Marland Kitchen CALCIUM PO Take by mouth 2 (two) times daily.    . diclofenac (VOLTAREN) 75 MG EC tablet diclofenac sodium 75 mg tablet,delayed release  Take 1 tablet twice a day by oral route.    . Evening Primrose Oil 500 MG CAPS evening primrose oil 500 mg capsule  Take by oral route.    . hydrochlorothiazide (MICROZIDE) 12.5 MG capsule hydrochlorothiazide 12.5 mg capsule  Take 1 capsule every day by oral route.    Marland Kitchen losartan (COZAAR) 50 MG tablet Take 50 mg by mouth daily.    . meloxicam (MOBIC) 15 MG tablet Take 1 tablet (15 mg total) by mouth daily. 30 tablet 1  . Misc Natural Products (LEG VEIN & CIRCULATION PO) Take by mouth 2 (two) times daily.    . MULTIPLE VITAMIN PO Take 1 tablet by mouth daily.     . Nutritional Supplements (ANTIOXIDANTS PO) Take by mouth daily.    . Omega-3 Fatty Acids (FISH OIL PO) Take by mouth 2 (two) times daily.    . rosuvastatin (CRESTOR) 10 MG tablet rosuvastatin 10 mg tablet  Take 1 tablet every day by oral route.    Marland Kitchen SPIRIVA RESPIMAT 1.25 MCG/ACT AERS     . methylPREDNISolone (MEDROL DOSEPAK) 4 MG TBPK tablet 6 day dose pack - take as directed (Patient not taking: Reported on 10/30/2019) 21 tablet 0  . ALOE VERA PO Take by mouth daily.    Boris Lown Oil 1000 MG CAPS Take 1 capsule by mouth daily.    Marland Kitchen Phytosterol Esters (CHOLEST CARE) 500 MG CAPS Take 1 capsule by mouth daily.     No facility-administered medications prior to visit.         Objective:   Physical Exam Vitals:   10/30/19 1545  BP: 120/76  Pulse: 81  Temp: 98.7 F (37.1 C)  TempSrc: Temporal  SpO2: 99%  Weight: 192 lb 3.2 oz (87.2 kg)  Height: 5\' 10"  (1.778 m)   Gen: Pleasant, well-nourished, in  no distress,  normal affect  ENT: No lesions,  mouth clear,  oropharynx clear, no postnasal drip  Neck: No JVD, no stridor  Lungs: No use of accessory muscles, no crackles or wheezing on normal respiration, she does wheeze  on a forced expiration.   Cardiovascular: RRR, heart sounds normal, no murmur or gallops, no peripheral edema  Musculoskeletal: No deformities, no cyanosis or clubbing  Neuro: alert, awake, non focal  Skin: Warm, no lesions or rash     Assessment & Plan:  COPD (chronic obstructive pulmonary disease) (HCC) Severe COPD.  Gold D based on spirometry criteria, gold C based on clinical status.  She has continuous dyspnea on Spiriva, would be a good candidate to change to [employer].  She does not flare frequently and I do not believe she needs the ICS component at this time.  We will do a trial of Stiolto, see if she benefits.  Continue albuterol as needed.  Her Covid vaccine is up-to-date.  We need to rule out occult desaturation and we will perform walking oximetry today.  Tobacco abuse She has undergone lung cancer screening, most recent was done 06/24/2019 at [facility], was a RADS 3 study, recommended follow-up in 6 months (June).  I had like to get her incorporated into our lung cancer screening program, ensure that the next scan is done in our system and that it is covered financially.   Levy Pupa, MD, PhD 10/30/2019, 4:20 PM [facility] [phone] or if no answer [phone]

## 2019-10-30 NOTE — Assessment & Plan Note (Signed)
Severe COPD.  Gold D based on spirometry criteria, gold C based on clinical status.  She has continuous dyspnea on Spiriva, would be a good candidate to change to [employer].  She does not flare frequently and I do not believe she needs the ICS component at this time.  We will do a trial of Stiolto, see if she benefits.  Continue albuterol as needed.  Her Covid vaccine is up-to-date.  We need to rule out occult desaturation and we will perform walking oximetry today.

## 2019-10-30 NOTE — Patient Instructions (Addendum)
Stop Spiriva for now. We will start Stiolto Respimat 2 puffs once a day. Keep your albuterol available to use 2 puffs if needed for shortness of breath, chest tightness, wheezing. Walking oximetry today on room air COVID-19 vaccine is up-to-date We will have you seen by our lung cancer screening program.  Your next low-dose CT scan of the chest should be in June 2021 to compare with your prior scans, most recently at [facility]. Follow with Dr Delton Coombes in 1 month or next available.

## 2019-10-30 NOTE — Assessment & Plan Note (Signed)
She has undergone lung cancer screening, most recent was done 06/24/2019 at [facility], was a RADS 3 study, recommended follow-up in 6 months (June).  I had like to get her incorporated into our lung cancer screening program, ensure that the next scan is done in our system and that it is covered financially.

## 2019-11-04 ENCOUNTER — Telehealth: Payer: Self-pay | Admitting: Podiatry

## 2019-11-04 NOTE — Telephone Encounter (Signed)
Pt wanted to know if she could get advice for a brace that she can purchase over the counter for ankle  Please advise

## 2019-11-05 NOTE — Telephone Encounter (Signed)
Left message informing pt I was not able to give instructions concerning braces not purchased from our office.

## 2019-11-16 ENCOUNTER — Ambulatory Visit: Payer: BC Managed Care – PPO | Admitting: Podiatry

## 2019-11-24 ENCOUNTER — Telehealth: Payer: Self-pay | Admitting: Emergency Medicine

## 2019-11-24 MED ORDER — STIOLTO RESPIMAT 2.5-2.5 MCG/ACT IN AERS
2.0000 | INHALATION_SPRAY | Freq: Every day | RESPIRATORY_TRACT | 0 refills | Status: DC
Start: 2019-11-24 — End: 2020-06-20

## 2019-11-24 MED ORDER — STIOLTO RESPIMAT 2.5-2.5 MCG/ACT IN AERS: 2.0000 | INHALATION_SPRAY | Freq: Every day | RESPIRATORY_TRACT | 3 refills | Status: DC

## 2019-11-24 NOTE — Telephone Encounter (Signed)
Spoke with patient. She was calling to see if we had any samples of Stiolto. She stated that this has really improved her breathing. I advised that we did and I would leave 1 sample at the door for her. I also advised her that we will need to send a RX to the pharmacy to see what her cost would be as we can not supply her with samples each month. She verbalized understanding.   Sample has been left up front. RX has been called into [facility] in [place].   Nothing further needed at time of call.

## 2019-11-25 ENCOUNTER — Encounter: Payer: Self-pay | Admitting: Internal Medicine

## 2019-12-07 ENCOUNTER — Ambulatory Visit: Payer: BC Managed Care – PPO | Admitting: Emergency Medicine

## 2019-12-08 DIAGNOSIS — Z0000 Encounter for general adult medical examination without abnormal findings: Secondary | ICD-10-CM | POA: Diagnosis not present

## 2019-12-08 DIAGNOSIS — E7849 Other hyperlipidemia: Secondary | ICD-10-CM | POA: Diagnosis not present

## 2019-12-08 DIAGNOSIS — I10 Essential (primary) hypertension: Secondary | ICD-10-CM | POA: Diagnosis not present

## 2019-12-14 DIAGNOSIS — E663 Overweight: Secondary | ICD-10-CM | POA: Diagnosis not present

## 2019-12-14 DIAGNOSIS — I2584 Coronary atherosclerosis due to calcified coronary lesion: Secondary | ICD-10-CM | POA: Diagnosis not present

## 2019-12-14 DIAGNOSIS — R911 Solitary pulmonary nodule: Secondary | ICD-10-CM | POA: Diagnosis not present

## 2019-12-14 DIAGNOSIS — I10 Essential (primary) hypertension: Secondary | ICD-10-CM | POA: Diagnosis not present

## 2019-12-14 DIAGNOSIS — M79671 Pain in right foot: Secondary | ICD-10-CM | POA: Diagnosis not present

## 2019-12-14 DIAGNOSIS — Z1212 Encounter for screening for malignant neoplasm of rectum: Secondary | ICD-10-CM | POA: Diagnosis not present

## 2019-12-14 DIAGNOSIS — Z0000 Encounter for general adult medical examination without abnormal findings: Secondary | ICD-10-CM | POA: Diagnosis not present

## 2019-12-14 DIAGNOSIS — Z1331 Encounter for screening for depression: Secondary | ICD-10-CM | POA: Diagnosis not present

## 2019-12-14 DIAGNOSIS — R82998 Other abnormal findings in urine: Secondary | ICD-10-CM | POA: Diagnosis not present

## 2019-12-31 ENCOUNTER — Telehealth: Payer: Self-pay | Admitting: Internal Medicine

## 2019-12-31 NOTE — Telephone Encounter (Signed)
Pt is due 10 yr TCS. Does she need OV or NV. [phone]

## 2020-01-05 ENCOUNTER — Encounter: Payer: Self-pay | Admitting: Internal Medicine

## 2020-01-05 NOTE — Telephone Encounter (Signed)
OV made and letter mailed °

## 2020-01-05 NOTE — Telephone Encounter (Signed)
She will need an OV due to meds.

## 2020-02-01 DIAGNOSIS — Z1231 Encounter for screening mammogram for malignant neoplasm of breast: Secondary | ICD-10-CM | POA: Diagnosis not present

## 2020-02-01 DIAGNOSIS — Z01419 Encounter for gynecological examination (general) (routine) without abnormal findings: Secondary | ICD-10-CM | POA: Diagnosis not present

## 2020-02-01 DIAGNOSIS — M1990 Unspecified osteoarthritis, unspecified site: Secondary | ICD-10-CM | POA: Insufficient documentation

## 2020-02-01 DIAGNOSIS — Z6827 Body mass index (BMI) 27.0-27.9, adult: Secondary | ICD-10-CM | POA: Diagnosis not present

## 2020-02-15 DIAGNOSIS — B078 Other viral warts: Secondary | ICD-10-CM | POA: Diagnosis not present

## 2020-02-15 DIAGNOSIS — D485 Neoplasm of uncertain behavior of skin: Secondary | ICD-10-CM | POA: Diagnosis not present

## 2020-02-26 ENCOUNTER — Ambulatory Visit: Payer: BC Managed Care – PPO | Admitting: Gastroenterology

## 2020-04-13 ENCOUNTER — Ambulatory Visit: Payer: BC Managed Care – PPO | Admitting: Gastroenterology

## 2020-05-09 ENCOUNTER — Other Ambulatory Visit: Payer: Self-pay

## 2020-05-09 ENCOUNTER — Ambulatory Visit
Admission: RE | Admit: 2020-05-09 | Discharge: 2020-05-09 | Disposition: A | Discharge status: Home / Self Care | Payer: BC Managed Care – PPO | Source: Ambulatory Visit | Attending: Emergency Medicine | Admitting: Emergency Medicine

## 2020-05-09 VITALS — BP 115/74 | HR 104 | Temp 97.8°F | Resp 19 | Ht 70.0 in | Wt 191.8 lb

## 2020-05-09 DIAGNOSIS — J019 Acute sinusitis, unspecified: Secondary | ICD-10-CM

## 2020-05-09 DIAGNOSIS — R059 Cough, unspecified: Secondary | ICD-10-CM

## 2020-05-09 MED ORDER — AMOXICILLIN-POT CLAVULANATE 875-125 MG PO TABS: 1.0000 | ORAL_TABLET | Freq: Two times a day (BID) | ORAL | 0 refills | Status: AC

## 2020-05-09 MED ORDER — DEXAMETHASONE SODIUM PHOSPHATE 10 MG/ML IJ SOLN
10.0000 mg | Freq: Once | INTRAMUSCULAR | Status: AC
  Administered 2020-05-09: 10 mg via INTRAMUSCULAR

## 2020-05-09 NOTE — ED Provider Notes (Signed)
[facility]   [id number] 05/09/20 Arrival Time: 1045   CC: COVID symptoms  SUBJECTIVE: History from: patient.  Natasha [name] is a 59 y.o. female who presents with sinus congestion, pain, pressure, and ear pressure x 4-5 days.  Denies sick exposure to COVID, flu or strep.  Has tried OTC sudafed without relief.  Symptoms are made worse with time.  Reports previous symptoms in the past with sinus infection.  Recieved covid vaccine and booster.   Denies fever, chills, SOB, wheezing, chest pain, nausea, changes in bowel or bladder habits.    Had negative covid test at [facility] 2 days ago.    ROS: As per HPI.  All other pertinent ROS negative.     Past Medical History:  Diagnosis Date  . Arthritis   . COPD (chronic obstructive pulmonary disease) (HCC)   . DDD (degenerative disc disease)   . DDD (degenerative disc disease), lumbosacral   . Depression   . Hemorrhoids   . Hyperlipidemia   . Hypertension   . Vitamin D deficiency    Past Surgical History:  Procedure Laterality Date  . CERVICAL DISC SURGERY  12/2010   Dr. Franky Macho  . MOUTH SURGERY    . TONSILLECTOMY     as teenager, in [place]  . TRIGGER FINGER RELEASE Left 04/16/2013   Procedure: RELEASE TRIGGER FINGER/A-1 PULLEY LEFT THUMB;  Surgeon: Wyn Forster., MD;  Location: [facility];  Service: Orthopedics;  Laterality: Left;  Marland Kitchen VAGINAL HYSTERECTOMY  06/2000   partial, Dr. Jeanine Luz   No Known Allergies No current facility-administered medications on file prior to encounter.   Current Outpatient Medications on File Prior to Encounter  Medication Sig Dispense Refill  . albuterol (PROVENTIL HFA;VENTOLIN HFA) 108 (90 Base) MCG/ACT inhaler Inhale 2 puffs into the lungs every 4 (four) hours as needed for wheezing or shortness of breath.    . ALPRAZolam (XANAX) 0.25 MG tablet Take 0.25 mg by mouth daily as needed.    Marland Kitchen BIOTIN PO Take by mouth daily.    . bisoprolol-hydrochlorothiazide (ZIAC)  10-6.25 MG tablet Take 1 tablet by mouth daily.    . Boswellia-Glucosamine-Vit D (GLUCOSAMINE COMPLEX PO) Take 1 tablet by mouth daily.    Marland Kitchen buPROPion (WELLBUTRIN SR) 150 MG 12 hr tablet Take 150 mg by mouth 2 (two) times daily.    Marland Kitchen CALCIUM PO Take by mouth 2 (two) times daily.    . diclofenac (VOLTAREN) 75 MG EC tablet diclofenac sodium 75 mg tablet,delayed release  Take 1 tablet twice a day by oral route.    . Evening Primrose Oil 500 MG CAPS evening primrose oil 500 mg capsule  Take by oral route.    . hydrochlorothiazide (MICROZIDE) 12.5 MG capsule hydrochlorothiazide 12.5 mg capsule  Take 1 capsule every day by oral route.    Marland Kitchen losartan (COZAAR) 50 MG tablet Take 50 mg by mouth daily.    . meloxicam (MOBIC) 15 MG tablet Take 1 tablet (15 mg total) by mouth daily. 30 tablet 1  . methylPREDNISolone (MEDROL DOSEPAK) 4 MG TBPK tablet 6 day dose pack - take as directed (Patient not taking: Reported on 10/30/2019) 21 tablet 0  . Misc Natural Products (LEG VEIN & CIRCULATION PO) Take by mouth 2 (two) times daily.    . MULTIPLE VITAMIN PO Take 1 tablet by mouth daily.     . Nutritional Supplements (ANTIOXIDANTS PO) Take by mouth daily.    . Omega-3 Fatty Acids (FISH OIL  PO) Take by mouth 2 (two) times daily.    . rosuvastatin (CRESTOR) 10 MG tablet rosuvastatin 10 mg tablet  Take 1 tablet every day by oral route.    Marland Kitchen SPIRIVA RESPIMAT 1.25 MCG/ACT AERS     . Tiotropium Bromide-Olodaterol (STIOLTO RESPIMAT) 2.5-2.5 MCG/ACT AERS Inhale 2 puffs into the lungs daily. 4 g 3  . Tiotropium Bromide-Olodaterol (STIOLTO RESPIMAT) 2.5-2.5 MCG/ACT AERS Inhale 2 puffs into the lungs daily. 4 g 0   Social History   Socioeconomic History  . Marital status: Single    Spouse name: Not on file  . Number of children: 0  . Years of education: Not on file  . Highest education level: Not on file  Occupational History  . Occupation: accounting  Tobacco Use  . Smoking status: Former Smoker    Packs/day: 1.00     Years: 35.00    Pack years: 35.00    Types: Cigarettes    Quit date: 08/01/2018    Years since quitting: 1.7  . Smokeless tobacco: Never Used  . Tobacco comment: takes a puff from time to time  Vaping Use  . Vaping Use: Never assessed  Substance and Sexual Activity  . Alcohol use: Yes    Comment: several times weekly  . Drug use: No  . Sexual activity: Yes    Birth control/protection: Post-menopausal  Other Topics Concern  . Not on file  Social History Narrative   Epworth Sleepiness Scale = 9 (as of 08/10/2015)   Social Determinants of Health   Financial Resource Strain:   . Difficulty of Paying Living Expenses: Not on file  Food Insecurity:   . Worried About Programme researcher, broadcasting/film/video in the Last Year: Not on file  . Ran Out of Food in the Last Year: Not on file  Transportation Needs:   . Lack of Transportation (Medical): Not on file  . Lack of Transportation (Non-Medical): Not on file  Physical Activity:   . Days of Exercise per Week: Not on file  . Minutes of Exercise per Session: Not on file  Stress:   . Feeling of Stress : Not on file  Social Connections:   . Frequency of Communication with Friends and Family: Not on file  . Frequency of Social Gatherings with Friends and Family: Not on file  . Attends Religious Services: Not on file  . Active Member of Clubs or Organizations: Not on file  . Attends Banker Meetings: Not on file  . Marital Status: Not on file  Intimate Partner Violence:   . Fear of Current or Ex-Partner: Not on file  . Emotionally Abused: Not on file  . Physically Abused: Not on file  . Sexually Abused: Not on file   Family History  Problem Relation Age of Onset  . COPD Mother   . Stroke Mother   . Hypertension Mother   . Hyperlipidemia Mother   . Depression Mother   . Hypertension Father   . Diabetes Father   . Cancer Father   . Hyperlipidemia Father   . Alcohol abuse Father   . Hyperlipidemia Brother   . Cancer Maternal  Grandmother   . Stroke Paternal Grandmother   . Diabetes Other   . Colitis Other     OBJECTIVE:  Vitals:   05/09/20 1054 05/09/20 1056  BP: 115/74   Pulse: (!) 104   Resp: 19   Temp: 97.8 F (36.6 C)   TempSrc: Oral   SpO2: 91%   Weight:  191 lb 12.8 oz (87 kg)  Height:  5\' 10"  (1.778 m)    General appearance: alert; appears fatigued, but nontoxic; speaking in full sentences and tolerating own secretions HEENT: NCAT; Ears: EACs clear, TMs pearly gray; Eyes: PERRL.  EOM grossly intact. Sinuses: TTP; Nose: nares patent without rhinorrhea, Throat: oropharynx clear, tonsils non erythematous or enlarged, uvula midline  Neck: supple without LAD Lungs: unlabored respirations, symmetrical air entry; cough: mild; no respiratory distress; CTAB Heart: regular rate and rhythm.   Skin: warm and dry Psychological: alert and cooperative; normal mood and affect  ASSESSMENT & PLAN:  1. Cough   2. Acute non-recurrent sinusitis, unspecified location     Meds ordered this encounter  Medications  . amoxicillin-clavulanate (AUGMENTIN) 875-125 MG tablet    Sig: Take 1 tablet by mouth every 12 (twelve) hours for 10 days.    Dispense:  20 tablet    Refill:  0    Order Specific Question:   Supervising Provider    Answer:   Eustace Fay  . dexamethasone (DECADRON) injection 10 mg    COVID testing ordered.  It will take between 5-7 days for test results.  Someone will contact you regarding abnormal results.    In the meantime: You should remain isolated in your home for 10 days from symptom onset AND greater than 72 hours after symptoms resolution (absence of fever without the use of fever-reducing medication and improvement in respiratory symptoms), whichever is longer Get plenty of rest and push fluids Augmentin for sinus infection Use OTC zyrtec for nasal congestion, runny nose, and/or sore throat Use OTC flonase for nasal congestion and runny nose Use medications daily  for symptom relief Use OTC medications like ibuprofen or tylenol as needed fever or pain Call or go to the ED if you have any new or worsening symptoms such as fever, cough, shortness of breath, chest tightness, chest pain, turning blue, changes in mental status, etc...   Patient request steroid shot.   Reviewed expectations re: course of current medical issues. Questions answered. Outlined signs and symptoms indicating need for more acute intervention. Patient verbalized understanding. After Visit Summary given.         [[id number]], PA-C 05/09/20 1146

## 2020-05-09 NOTE — ED Triage Notes (Addendum)
Sinus congestion that started Thursday. Ears feel like they have fluid in them. She took sudafed that helped with her ears.  Has had mild cough.  Pt had neg covid test at [facility] on Saturday

## 2020-05-09 NOTE — Discharge Instructions (Signed)
COVID testing ordered.  It will take between 5-7 days for test results.  Someone will contact you regarding abnormal results.    In the meantime: You should remain isolated in your home for 10 days from symptom onset AND greater than 72 hours after symptoms resolution (absence of fever without the use of fever-reducing medication and improvement in respiratory symptoms), whichever is longer Get plenty of rest and push fluids Augmentin for sinus infection Use OTC zyrtec for nasal congestion, runny nose, and/or sore throat Use OTC flonase for nasal congestion and runny nose Use medications daily for symptom relief Use OTC medications like ibuprofen or tylenol as needed fever or pain Call or go to the ED if you have any new or worsening symptoms such as fever, cough, shortness of breath, chest tightness, chest pain, turning blue, changes in mental status, etc..Marland Kitchen

## 2020-05-11 LAB — NOVEL CORONAVIRUS, NAA: SARS-CoV-2, NAA: NOT DETECTED

## 2020-05-11 LAB — SARS-COV-2, NAA 2 DAY TAT

## 2020-05-25 ENCOUNTER — Ambulatory Visit: Payer: BC Managed Care – PPO | Admitting: Gastroenterology

## 2020-05-30 ENCOUNTER — Other Ambulatory Visit: Payer: Self-pay

## 2020-05-30 ENCOUNTER — Ambulatory Visit
Admission: RE | Admit: 2020-05-30 | Discharge: 2020-05-30 | Disposition: A | Discharge status: Home / Self Care | Payer: BC Managed Care – PPO | Source: Ambulatory Visit | Attending: Emergency Medicine | Admitting: Emergency Medicine

## 2020-05-30 ENCOUNTER — Telehealth: Payer: Self-pay | Admitting: Emergency Medicine

## 2020-05-30 VITALS — BP 149/90 | HR 82 | Temp 97.7°F | Resp 15

## 2020-05-30 DIAGNOSIS — J0101 Acute recurrent maxillary sinusitis: Secondary | ICD-10-CM

## 2020-05-30 DIAGNOSIS — Z87891 Personal history of nicotine dependence: Secondary | ICD-10-CM

## 2020-05-30 MED ORDER — AZITHROMYCIN 250 MG PO TABS: 250.0000 mg | ORAL_TABLET | Freq: Every day | ORAL | 0 refills | Status: DC

## 2020-05-30 MED ORDER — PREDNISONE 10 MG PO TABS: 20.0000 mg | ORAL_TABLET | Freq: Every day | ORAL | 0 refills | Status: DC

## 2020-05-30 MED ORDER — FLUTICASONE PROPIONATE 50 MCG/ACT NA SUSP
1.0000 | Freq: Every day | NASAL | 0 refills | Status: DC
Start: 2020-05-30 — End: 2022-03-27

## 2020-05-30 NOTE — ED Provider Notes (Signed)
[facility]ENTER   [id number] 05/30/20 Arrival Time: 1834   Chief Complaint  Patient presents with  . Facial Pain     SUBJECTIVE: History from: patient.  Natasha [name] is a 59 y.o. female presented to the urgent care with a complaint of sinus pressure sinus pain for the past 4 weeks.  Was seen at the urgent care couple weeks ago and was prescribed Augmentin.  Patient reports symptom improved and then returned.  Denies sick exposure to COVID, flu or strep.  Denies recent travel. Symptoms are made worse with lying down.  Denies previous symptoms in the past.   Denies fever, chills, fatigue, , rhinorrhea, sore throat, SOB, wheezing, chest pain, nausea, changes in bowel or bladder habits.        Received flu shot this year: no.  ROS: As per HPI.  All other pertinent ROS negative.     Past Medical History:  Diagnosis Date  . Arthritis   . COPD (chronic obstructive pulmonary disease) (HCC)   . DDD (degenerative disc disease)   . DDD (degenerative disc disease), lumbosacral   . Depression   . Hemorrhoids   . Hyperlipidemia   . Hypertension   . Vitamin D deficiency    Past Surgical History:  Procedure Laterality Date  . CERVICAL DISC SURGERY  12/2010   Dr. Franky Macho  . MOUTH SURGERY    . TONSILLECTOMY     as teenager, in [place]  . TRIGGER FINGER RELEASE Left 04/16/2013   Procedure: RELEASE TRIGGER FINGER/A-1 PULLEY LEFT THUMB;  Surgeon: Wyn Forster., MD;  Location: [facility];  Service: Orthopedics;  Laterality: Left;  Marland Kitchen VAGINAL HYSTERECTOMY  06/2000   partial, Dr. Jeanine Luz   No Known Allergies No current facility-administered medications on file prior to encounter.   Current Outpatient Medications on File Prior to Encounter  Medication Sig Dispense Refill  . albuterol (PROVENTIL HFA;VENTOLIN HFA) 108 (90 Base) MCG/ACT inhaler Inhale 2 puffs into the lungs every 4 (four) hours as needed for wheezing or shortness of breath.    . ALPRAZolam  (XANAX) 0.25 MG tablet Take 0.25 mg by mouth daily as needed.    Marland Kitchen BIOTIN PO Take by mouth daily.    . bisoprolol-hydrochlorothiazide (ZIAC) 10-6.25 MG tablet Take 1 tablet by mouth daily.    . Boswellia-Glucosamine-Vit D (GLUCOSAMINE COMPLEX PO) Take 1 tablet by mouth daily.    Marland Kitchen buPROPion (WELLBUTRIN SR) 150 MG 12 hr tablet Take 150 mg by mouth 2 (two) times daily.    Marland Kitchen CALCIUM PO Take by mouth 2 (two) times daily.    . diclofenac (VOLTAREN) 75 MG EC tablet diclofenac sodium 75 mg tablet,delayed release  Take 1 tablet twice a day by oral route.    . Evening Primrose Oil 500 MG CAPS evening primrose oil 500 mg capsule  Take by oral route.    . hydrochlorothiazide (MICROZIDE) 12.5 MG capsule hydrochlorothiazide 12.5 mg capsule  Take 1 capsule every day by oral route.    Marland Kitchen losartan (COZAAR) 50 MG tablet Take 50 mg by mouth daily.    . meloxicam (MOBIC) 15 MG tablet Take 1 tablet (15 mg total) by mouth daily. 30 tablet 1  . methylPREDNISolone (MEDROL DOSEPAK) 4 MG TBPK tablet 6 day dose pack - take as directed (Patient not taking: Reported on 10/30/2019) 21 tablet 0  . Misc Natural Products (LEG VEIN & CIRCULATION PO) Take by mouth 2 (two) times daily.    . MULTIPLE VITAMIN  PO Take 1 tablet by mouth daily.     . Nutritional Supplements (ANTIOXIDANTS PO) Take by mouth daily.    . Omega-3 Fatty Acids (FISH OIL PO) Take by mouth 2 (two) times daily.    . rosuvastatin (CRESTOR) 10 MG tablet rosuvastatin 10 mg tablet  Take 1 tablet every day by oral route.    Marland Kitchen SPIRIVA RESPIMAT 1.25 MCG/ACT AERS     . Tiotropium Bromide-Olodaterol (STIOLTO RESPIMAT) 2.5-2.5 MCG/ACT AERS Inhale 2 puffs into the lungs daily. 4 g 3  . Tiotropium Bromide-Olodaterol (STIOLTO RESPIMAT) 2.5-2.5 MCG/ACT AERS Inhale 2 puffs into the lungs daily. 4 g 0   Social History   Socioeconomic History  . Marital status: Single    Spouse name: Not on file  . Number of children: 0  . Years of education: Not on file  . Highest  education level: Not on file  Occupational History  . Occupation: accounting  Tobacco Use  . Smoking status: Former Smoker    Packs/day: 1.00    Years: 35.00    Pack years: 35.00    Types: Cigarettes    Quit date: 08/01/2018    Years since quitting: 1.8  . Smokeless tobacco: Never Used  . Tobacco comment: takes a puff from time to time  Vaping Use  . Vaping Use: Never assessed  Substance and Sexual Activity  . Alcohol use: Yes    Comment: several times weekly  . Drug use: No  . Sexual activity: Yes    Birth control/protection: Post-menopausal  Other Topics Concern  . Not on file  Social History Narrative   Epworth Sleepiness Scale = 9 (as of 08/10/2015)   Social Determinants of Health   Financial Resource Strain:   . Difficulty of Paying Living Expenses: Not on file  Food Insecurity:   . Worried About Programme researcher, broadcasting/film/video in the Last Year: Not on file  . Ran Out of Food in the Last Year: Not on file  Transportation Needs:   . Lack of Transportation (Medical): Not on file  . Lack of Transportation (Non-Medical): Not on file  Physical Activity:   . Days of Exercise per Week: Not on file  . Minutes of Exercise per Session: Not on file  Stress:   . Feeling of Stress : Not on file  Social Connections:   . Frequency of Communication with Friends and Family: Not on file  . Frequency of Social Gatherings with Friends and Family: Not on file  . Attends Religious Services: Not on file  . Active Member of Clubs or Organizations: Not on file  . Attends Banker Meetings: Not on file  . Marital Status: Not on file  Intimate Partner Violence:   . Fear of Current or Ex-Partner: Not on file  . Emotionally Abused: Not on file  . Physically Abused: Not on file  . Sexually Abused: Not on file   Family History  Problem Relation Age of Onset  . COPD Mother   . Stroke Mother   . Hypertension Mother   . Hyperlipidemia Mother   . Depression Mother   . Hypertension Father    . Diabetes Father   . Cancer Father   . Hyperlipidemia Father   . Alcohol abuse Father   . Hyperlipidemia Brother   . Cancer Maternal Grandmother   . Stroke Paternal Grandmother   . Diabetes Other   . Colitis Other     OBJECTIVE:  Vitals:   05/30/20 1945  BP: Marland Kitchen)  149/90  Pulse: 82  Resp: 15  Temp: 97.7 F (36.5 C)  SpO2: 95%     Physical Exam Vitals and nursing note reviewed.  Constitutional:      General: She is not in acute distress.    Appearance: Normal appearance. She is normal weight. She is not ill-appearing, toxic-appearing or diaphoretic.  HENT:     Head: Normocephalic.     Right Ear: Ear canal and external ear normal. A middle ear effusion is present. There is no impacted cerumen.     Left Ear: Ear canal and external ear normal. A middle ear effusion is present. There is no impacted cerumen.     Nose:     Right Sinus: Maxillary sinus tenderness present.     Left Sinus: Maxillary sinus tenderness present.  Cardiovascular:     Rate and Rhythm: Normal rate and regular rhythm.     Pulses: Normal pulses.     Heart sounds: Normal heart sounds. No murmur heard.  No friction rub. No gallop.   Pulmonary:     Effort: Pulmonary effort is normal. No respiratory distress.     Breath sounds: Normal breath sounds. No stridor. No wheezing, rhonchi or rales.  Chest:     Chest wall: No tenderness.  Neurological:     Mental Status: She is alert and oriented to person, place, and time.     LABS:  No results found for this or any previous visit (from the past 24 hour(s)).   ASSESSMENT & PLAN:  1. Acute recurrent maxillary sinusitis     Meds ordered this encounter  Medications  . fluticasone (FLONASE) 50 MCG/ACT nasal spray    Sig: Place 1 spray into both nostrils daily for 14 days.    Dispense:  16 g    Refill:  0  . predniSONE (DELTASONE) 10 MG tablet    Sig: Take 2 tablets (20 mg total) by mouth daily.    Dispense:  15 tablet    Refill:  0  . azithromycin  (ZITHROMAX) 250 MG tablet    Sig: Take 1 tablet (250 mg total) by mouth daily. Take first 2 tablets together, then 1 every day until finished.    Dispense:  6 tablet    Refill:  0   Discharge instructions  Get plenty of rest and push fluids Flonase was prescribed  Azithromycin was prescribed  Prednisone was prescribed/take as directed  use medications daily for symptom relief Use OTC medications like ibuprofen or tylenol as needed fever or pain Call or go to the ED if you have any new or worsening symptoms such as fever, worsening cough, shortness of breath, chest tightness, chest pain, turning blue, changes in mental status, etc...  Reviewed expectations re: course of current medical issues. Questions answered. Outlined signs and symptoms indicating need for more acute intervention. Patient verbalized understanding. After Visit Summary given.         Durward Parcel, FNP 05/30/20 2042

## 2020-05-30 NOTE — ED Triage Notes (Signed)
Pt returns for continued symptoms from last visit, most concerned about ear pressure

## 2020-05-30 NOTE — Discharge Instructions (Addendum)
Get plenty of rest and push fluids Flonase was prescribed  Azithromycin was prescribed  Prednisone was prescribed/take as directed  use medications daily for symptom relief Use OTC medications like ibuprofen or tylenol as needed fever or pain Call or go to the ED if you have any new or worsening symptoms such as fever, worsening cough, shortness of breath, chest tightness, chest pain, turning blue, changes in mental status, etc..Marland Kitchen

## 2020-05-31 NOTE — Telephone Encounter (Signed)
LMTCB for pt 

## 2020-06-02 NOTE — Telephone Encounter (Signed)
lmtcb for pt.  

## 2020-06-03 NOTE — Telephone Encounter (Signed)
Patient is returning phone call. Patient phone number is [phone].

## 2020-06-03 NOTE — Telephone Encounter (Signed)
Spoke with the pt  She states never had her LDCT chest in June 2021 bc she wanted to wait   She had her last one Dec 2020 at [facility]  She is asking that we go ahead and schedule her for LDCT chest at [facility] before the end of the year  Please advise if okay to order, thanks!

## 2020-06-06 NOTE — Telephone Encounter (Signed)
ATC patient to let her know recs from Dr. Delton Coombes per DPR left detailed message advised patient to call back if she would like to proceed with CT scan. Will wait to hear from patient

## 2020-06-06 NOTE — Telephone Encounter (Signed)
Yes, I would like for her to go ahead and get the LDCT for continued lung cancer screening. Then OV w me to review. Thanks.

## 2020-06-07 NOTE — Telephone Encounter (Signed)
Lmtcb for pt.   Will forward to PCC's to also try pt to get scan scheduled.

## 2020-06-07 NOTE — Telephone Encounter (Signed)
Denise Dr. Delton Coombes referred this patient to the [facility] Screening [facility] and she wanted to wait until Dec. 2021 to schedule. Please look at referral

## 2020-06-09 NOTE — Telephone Encounter (Signed)
LMTC x `1 for pt 

## 2020-06-13 NOTE — Telephone Encounter (Signed)
Spoke with pt and scheduled SDMV 06/20/20 4:00 CT ordered Nothing further needed

## 2020-06-20 ENCOUNTER — Ambulatory Visit (INDEPENDENT_AMBULATORY_CARE_PROVIDER_SITE_OTHER): Payer: BC Managed Care – PPO | Admitting: Acute Care

## 2020-06-20 ENCOUNTER — Other Ambulatory Visit: Payer: Self-pay

## 2020-06-20 ENCOUNTER — Encounter: Payer: Self-pay | Admitting: Acute Care

## 2020-06-20 DIAGNOSIS — F17210 Nicotine dependence, cigarettes, uncomplicated: Secondary | ICD-10-CM

## 2020-06-20 DIAGNOSIS — Z122 Encounter for screening for malignant neoplasm of respiratory organs: Secondary | ICD-10-CM

## 2020-06-20 NOTE — Progress Notes (Signed)
Shared Decision Making Visit Lung Cancer Screening Program [phone]) Virtual Visit via Telephone Note  I connected with Natasha [name] on 06/20/20 at  4:00 PM EST by telephone and verified that I am speaking with the correct person using two identifiers.  Location: Patient: At home Provider: [address], [place], [place], [address]   I discussed the limitations, risks, security and privacy concerns of performing an evaluation and management service by telephone and the availability of in person appointments. I also discussed with the patient that there may be a patient responsible charge related to this service. The patient expressed understanding and agreed to proceed.     Eligibility:  Age 59 y.o.  Pack Years Smoking History Calculation 44 pack year smoking history (# packs/per year x # years smoked)  Recent History of coughing up blood  no  Unexplained weight loss? no ( >Than 15 pounds within the last 6 months )  Prior History Lung / other cancer no (Diagnosis within the last 5 years already requiring surveillance chest CT Scans).  Smoking Status Former Smoker  Former Smokers: Years since quit: 1 year  Quit Date: 08/2018  Visit Components:  Discussion included one or more decision making aids. yes  Discussion included risk/benefits of screening. yes  Discussion included potential follow up diagnostic testing for abnormal scans. yes  Discussion included meaning and risk of over diagnosis. yes  Discussion included meaning and risk of False Positives. yes  Discussion included meaning of total radiation exposure. yes  Counseling Included:  Importance of adherence to annual lung cancer LDCT screening. yes  Impact of comorbidities on ability to participate in the program. yes  Ability and willingness to under diagnostic treatment. yes  Smoking Cessation Counseling:  Current Smokers:   Discussed importance of smoking cessation. yes  Information about  tobacco cessation classes and interventions provided to patient. yes  Patient provided with "ticket" for LDCT Scan. yes  Symptomatic Patient. no  Counseling  Diagnosis Code: Tobacco Use Z72.0  Asymptomatic Patient yes  Counseling (Intermediate counseling: > three minutes counseling) F7510  Former Smokers:   Discussed the importance of maintaining cigarette abstinence. yes  Diagnosis Code: Personal History of Nicotine Dependence. C58.527  Information about tobacco cessation classes and interventions provided to patient. Yes  Patient provided with "ticket" for LDCT Scan. yes  Written Order for Lung Cancer Screening with LDCT placed in Epic. Yes (CT Chest Lung Cancer Screening Low Dose W/O CM) [id number] Z12.2-Screening of respiratory organs Z87.891-Personal history of nicotine dependence  I spent 25 minutes of face to face time with  Natasha [name] discussing the risks and benefits of lung cancer screening. We viewed a power point together that explained in detail the above noted topics. We took the time to pause the power point at intervals to allow for questions to be asked and answered to ensure understanding. We discussed that she had taken the single most powerful action possible to decrease her risk of developing lung cancer when she quit smoking. I counseled her to remain smoke free, and to contact me if she ever had the desire to smoke again so that I can provide resources and tools to help support the effort to remain smoke free. We discussed the time and location of the scan, and that either  Abigail Miyamoto RN or I will call with the results within  24-48 hours of receiving them. She has my card and contact information in the event she needs to speak with me, in addition  to a copy of the power point we reviewed as a resource. She verbalized understanding of all of the above and had no further questions upon leaving the office.     I explained to the patient that there has been a high  incidence of coronary artery disease noted on these exams. I explained that this is a non-gated exam therefore degree or severity cannot be determined. This patient is on statin therapy. I have asked the patient to follow-up with their PCP regarding any incidental finding of coronary artery disease and management with diet or medication as they feel is clinically indicated. The patient verbalized understanding of the above and had no further questions.     Bevelyn Ngo, NP 06/20/2020 4:36 PM

## 2020-06-20 NOTE — Patient Instructions (Signed)
Thank you for participating in the [facility]. It was our pleasure to meet you today. We will call you with the results of your scan within the next few days. Your scan will be assigned a Lung RADS category score by the physicians reading the scans.  This Lung RADS score determines follow up scanning.  See below for description of categories, and follow up screening recommendations. We will be in touch to schedule your follow up screening annually or based on recommendations of our providers. We will fax a copy of your scan results to your Primary Care Physician, or the physician who referred you to the program, to ensure they have the results. Please call the office if you have any questions or concerns regarding your scanning experience or results.  Our office number is [phone]. Please speak with Denise Phelps, RN. She is our Lung Cancer Screening RN. If she is unavailable when you call, please have the office staff send her a message. She will return your call at her earliest convenience. Remember, if your scan is normal, we will scan you annually as long as you continue to meet the criteria for the program. (Age 55-77, Current smoker or smoker who has quit within the last 15 years). If you are a smoker, remember, quitting is the single most powerful action that you can take to decrease your risk of lung cancer and other pulmonary, breathing related problems. We know quitting is hard, and we are here to help.  Please let us know if there is anything we can do to help you meet your goal of quitting. If you are a former smoker, congratulations. We are proud of you! Remain smoke free! Remember you can refer friends or family members through the number above.  We will screen them to make sure they meet criteria for the program. Thank you for helping us take better care of you by participating in Lung Screening.  Lung RADS Categories:  Lung RADS 1: no nodules  or definitely non-concerning nodules.  Recommendation is for a repeat annual scan in 12 months.  Lung RADS 2:  nodules that are non-concerning in appearance and behavior with a very low likelihood of becoming an active cancer. Recommendation is for a repeat annual scan in 12 months.  Lung RADS 3: nodules that are probably non-concerning , includes nodules with a low likelihood of becoming an active cancer.  Recommendation is for a 6-month repeat screening scan. Often noted after an upper respiratory illness. We will be in touch to make sure you have no questions, and to schedule your 6-month scan.  Lung RADS 4 A: nodules with concerning findings, recommendation is most often for a follow up scan in 3 months or additional testing based on our provider's assessment of the scan. We will be in touch to make sure you have no questions and to schedule the recommended 3 month follow up scan.  Lung RADS 4 B:  indicates findings that are concerning. We will be in touch with you to schedule additional diagnostic testing based on our provider's  assessment of the scan.   

## 2020-06-30 ENCOUNTER — Ambulatory Visit
Admission: RE | Admit: 2020-06-30 | Discharge: 2020-06-30 | Disposition: A | Discharge status: Home / Self Care | Payer: BC Managed Care – PPO | Source: Ambulatory Visit | Attending: Acute Care | Admitting: Acute Care

## 2020-06-30 DIAGNOSIS — Z87891 Personal history of nicotine dependence: Secondary | ICD-10-CM

## 2020-06-30 DIAGNOSIS — J432 Centrilobular emphysema: Secondary | ICD-10-CM | POA: Diagnosis not present

## 2020-06-30 DIAGNOSIS — I2510 Atherosclerotic heart disease of native coronary artery without angina pectoris: Secondary | ICD-10-CM | POA: Diagnosis not present

## 2020-06-30 DIAGNOSIS — I700 Atherosclerosis of aorta: Secondary | ICD-10-CM | POA: Diagnosis not present

## 2020-07-05 NOTE — Progress Notes (Signed)

## 2020-07-07 ENCOUNTER — Other Ambulatory Visit: Payer: Self-pay | Admitting: *Deleted

## 2020-07-07 DIAGNOSIS — Z87891 Personal history of nicotine dependence: Secondary | ICD-10-CM

## 2020-07-12 ENCOUNTER — Encounter: Payer: Self-pay | Admitting: *Deleted

## 2020-07-12 ENCOUNTER — Encounter: Payer: Self-pay | Admitting: Gastroenterology

## 2020-07-12 ENCOUNTER — Ambulatory Visit (INDEPENDENT_AMBULATORY_CARE_PROVIDER_SITE_OTHER): Payer: 59 | Admitting: Gastroenterology

## 2020-07-12 ENCOUNTER — Other Ambulatory Visit: Payer: Self-pay

## 2020-07-12 DIAGNOSIS — Z79899 Other long term (current) drug therapy: Secondary | ICD-10-CM

## 2020-07-12 DIAGNOSIS — Z1211 Encounter for screening for malignant neoplasm of colon: Secondary | ICD-10-CM | POA: Insufficient documentation

## 2020-07-12 NOTE — Progress Notes (Signed)
Primary Care Physician:  Creola Corn, MD  Primary Gastroenterologist:  Roetta Sessions, MD   Chief Complaint  Patient presents with  . Colonoscopy    Due for tcs. Not having any problems    HPI:  Natasha [name] is a 60 y.o. female here to schedule screening colonoscopy.  Last colonoscopy was in 2011.  She had pancolonic diverticulosis.  Clinically doing well from a GI standpoint.  Bowel movements regular.  No blood in the stool or melena.  Denies abdominal pain.  No heartburn or indigestion.  No dysphagia.  No unintentional weight loss.  Patient has a history of COPD, followed by pulmonology.   Current Outpatient Medications  Medication Sig Dispense Refill  . acetaminophen (TYLENOL) 500 MG tablet Take 1,000 mg by mouth every 6 (six) hours as needed.    Marland Kitchen albuterol (PROVENTIL HFA;VENTOLIN HFA) 108 (90 Base) MCG/ACT inhaler Inhale 2 puffs into the lungs every 4 (four) hours as needed for wheezing or shortness of breath.    . ALPRAZolam (XANAX) 0.25 MG tablet Take 0.25 mg by mouth daily as needed.    Marland Kitchen BIOTIN PO Take by mouth daily.    . bisoprolol-hydrochlorothiazide (ZIAC) 10-6.25 MG tablet Take 1 tablet by mouth daily.    . [place]well[place]-Glucosamine-Vit D (GLUCOSAMINE COMPLEX PO) Take 1 tablet by mouth daily.    Marland Kitchen buPROPion (WELLBUTRIN SR) 150 MG 12 hr tablet Take 150 mg by mouth. Takes 1 tablet every 3 days (trying to stop med)    . CALCIUM PO Take by mouth 2 (two) times daily.    . Evening Primrose Oil 500 MG CAPS evening primrose oil 500 mg capsule  Take by oral route.    . fluticasone (FLONASE) 50 MCG/ACT nasal spray Place 1 spray into both nostrils daily for 14 days. (Patient taking differently: Place 1 spray into both nostrils as needed.) 16 g 0  . GINSENG PO Take by mouth daily.    . hydrochlorothiazide (MICROZIDE) 12.5 MG capsule Take 12.5 mg by mouth as needed.    Marland Kitchen losartan (COZAAR) 50 MG tablet Take 50 mg by mouth daily.    . meloxicam (MOBIC) 15 MG tablet Take 1 tablet (15 mg  total) by mouth daily. (Patient taking differently: Take 15 mg by mouth as needed.) 30 tablet 1  . Misc Natural Products (LEG VEIN & CIRCULATION PO) Take by mouth 2 (two) times daily.    . MULTIPLE VITAMIN PO Take 1 tablet by mouth daily.     . Nutritional Supplements (ANTIOXIDANTS PO) Take by mouth daily.    . Omega-3 Fatty Acids (FISH OIL PO) Take by mouth 2 (two) times daily.    . rosuvastatin (CRESTOR) 10 MG tablet rosuvastatin 10 mg tablet  Take 1 tablet every day by oral route.    . Saw Palmetto, Serenoa repens, (SAW PALMETTO PO) Take by mouth daily.    . Tiotropium Bromide-Olodaterol (STIOLTO RESPIMAT) 2.5-2.5 MCG/ACT AERS Inhale 2 puffs into the lungs daily. 4 g 3   No current facility-administered medications for this visit.    Allergies as of 07/12/2020  . (No Known Allergies)    Past Medical History:  Diagnosis Date  . Arthritis   . COPD (chronic obstructive pulmonary disease) (HCC)   . DDD (degenerative disc disease)   . DDD (degenerative disc disease), lumbosacral   . Depression   . Hemorrhoids   . Hyperlipidemia   . Hypertension   . Vitamin D deficiency     Past Surgical History:  Procedure Laterality  Date  . CERVICAL DISC SURGERY  12/2010   Dr. Franky Macho  . COLONOSCOPY  2011   Rourk: Pancolonic diverticulosis  . MOUTH SURGERY    . TONSILLECTOMY     as teenager, in [place]  . TRIGGER FINGER RELEASE Left 04/16/2013   Procedure: RELEASE TRIGGER FINGER/A-1 PULLEY LEFT THUMB;  Surgeon: Wyn Forster., MD;  Location: [facility];  Service: Orthopedics;  Laterality: Left;  Marland Kitchen VAGINAL HYSTERECTOMY  06/2000   partial, Dr. Jeanine Luz    Family History  Problem Relation Age of Onset  . COPD Mother   . Stroke Mother   . Hypertension Mother   . Hyperlipidemia Mother   . Depression Mother   . Hypertension Father   . Diabetes Father   . Cancer Father   . Hyperlipidemia Father   . Alcohol abuse Father   . Hyperlipidemia Brother   . Cancer  Maternal Grandmother   . Stroke Paternal Grandmother   . Diabetes Other   . Colitis Other     Social History   Socioeconomic History  . Marital status: Single    Spouse name: Not on file  . Number of children: 0  . Years of education: Not on file  . Highest education level: Not on file  Occupational History  . Occupation: accounting  Tobacco Use  . Smoking status: Former Smoker    Packs/day: 1.00    Years: 35.00    Pack years: 35.00    Types: Cigarettes    Quit date: 08/01/2018    Years since quitting: 1.9  . Smokeless tobacco: Never Used  . Tobacco comment: takes a puff from time to time  Vaping Use  . Vaping Use: Not on file  Substance and Sexual Activity  . Alcohol use: Yes    Comment: 2-3 times weekly-couple beers/couple drinks at a time  . Drug use: No  . Sexual activity: Yes    Birth control/protection: Post-menopausal  Other Topics Concern  . Not on file  Social History Narrative   Epworth Sleepiness Scale = 9 (as of 08/10/2015)   Social Determinants of Health   Financial Resource Strain: Not on file  Food Insecurity: Not on file  Transportation Needs: Not on file  Physical Activity: Not on file  Stress: Not on file  Social Connections: Not on file  Intimate Partner Violence: Not on file      ROS:  General: Negative for anorexia, weight loss, fever, chills, fatigue, weakness. Eyes: Negative for vision changes.  ENT: Negative for hoarseness, difficulty swallowing , nasal congestion. CV: Negative for chest pain, angina, palpitations, positive dyspnea on exertion, peripheral edema.  Respiratory: Negative for dyspnea at rest, positive dyspnea on exertion, cough, sputum, wheezing.  GI: See history of present illness. GU:  Negative for dysuria, hematuria, urinary incontinence, urinary frequency, nocturnal urination.  MS: Negative for joint pain, low back pain.  Derm: Negative for rash or itching.  Neuro: Negative for weakness, abnormal sensation, seizure,  frequent headaches, memory loss, confusion.  Psych: Negative for anxiety, depression, suicidal ideation, hallucinations.  Endo: Negative for unusual weight change.  Heme: Negative for bruising or bleeding. Allergy: Negative for rash or hives.    Physical Examination:  BP (!) 141/91   Pulse 62   Temp (!) 96.6 F (35.9 C) (Temporal)   Ht 5\' 10"  (1.778 m)   Wt 191 lb 6.4 oz (86.8 kg)   BMI 27.46 kg/m    General: Well-nourished, well-developed in no acute distress.  Head: Normocephalic, atraumatic.   Eyes: Conjunctiva pink, no icterus. Mouth:masked Neck: Supple without thyromegaly, masses, or lymphadenopathy.  Lungs: Clear to auscultation bilaterally.  Heart: Regular rate and rhythm, no murmurs rubs or gallops.  Abdomen: Bowel sounds are normal, nontender, nondistended, no hepatosplenomegaly or masses, no abdominal bruits or    hernia , no rebound or guarding.   Rectal: Not performed Extremities: No lower extremity edema. No clubbing or deformities.  Neuro: Alert and oriented x 4 , grossly normal neurologically.  Skin: Warm and dry, no rash or jaundice.   Psych: Alert and cooperative, normal mood and affect.    Impression/plan:  60 year old female presenting to schedule 10-year screening colonoscopy.  Denies any bowel concerns.  No family history of colon cancer.  Last colonoscopy in 2011.  History of pancolonic diverticulosis.  Colonoscopy in the near future with Dr. Jena Gauss.  Plan for deep sedation given polypharmacy. ASA III (COPD).  I have discussed the risks, alternatives, benefits with regards to but not limited to the risk of reaction to medication, bleeding, infection, perforation and the patient is agreeable to proceed. Written consent to be obtained.

## 2020-07-12 NOTE — Patient Instructions (Signed)
1. Colonoscopy as scheduled. Please see separate instructions. 

## 2020-07-12 NOTE — Progress Notes (Signed)
Cc'ed to pcp °

## 2020-07-21 ENCOUNTER — Telehealth: Payer: Self-pay | Admitting: Internal Medicine

## 2020-07-21 NOTE — Telephone Encounter (Signed)
Patient would like her tcs prep to be sent to [facility] on [address]

## 2020-07-21 NOTE — Telephone Encounter (Signed)
error 

## 2020-07-21 NOTE — Telephone Encounter (Signed)
Called and informed pt everything for her prep is OTC (Miralax prep).

## 2020-07-26 ENCOUNTER — Other Ambulatory Visit: Payer: Self-pay | Admitting: Emergency Medicine

## 2020-08-02 ENCOUNTER — Telehealth: Payer: Self-pay | Admitting: Emergency Medicine

## 2020-08-02 NOTE — Telephone Encounter (Signed)
Called and spoke to pt. Pt states her Stiolto is close to $200 per month and is covered by insurance but at a tier 3. Advised pt to call insurance and see what other covered alternatives there are at a lower tier and to call us back. Pt verbalized understanding and denied any further questions or concerns at this time.  Will keep message open to follow up on.

## 2020-08-16 NOTE — Telephone Encounter (Signed)
lmtcb for pt.  

## 2020-08-23 ENCOUNTER — Telehealth: Payer: Self-pay | Admitting: Internal Medicine

## 2020-08-23 NOTE — Telephone Encounter (Signed)
Patient returned call. She has been rescheduled to 4/25 am appt. Aware will mail new instructions with new pre-op/covid test appt. Called endo and made aware of change.

## 2020-08-23 NOTE — Telephone Encounter (Signed)
Lmtcb for pt.  

## 2020-08-23 NOTE — Telephone Encounter (Signed)
LMOVM for pt 

## 2020-08-23 NOTE — Telephone Encounter (Signed)
Patient needs to reschedule her procedure [phone]

## 2020-08-26 ENCOUNTER — Other Ambulatory Visit (HOSPITAL_COMMUNITY): Payer: 59

## 2020-08-26 ENCOUNTER — Encounter (HOSPITAL_COMMUNITY): Payer: 59

## 2020-08-26 NOTE — Telephone Encounter (Signed)
Due to several unsuccessful attempts to reach pt this message will be closed, per triage protocol.

## 2020-10-17 NOTE — Patient Instructions (Signed)
KINDEL ROCHEFORT  10/17/2020     @PREFPERIOPPHARMACY @   Your procedure is scheduled on   10/24/2020.   Report to 10/26/2020 at  0800  A.M.   Call this number if you have problems the morning of surgery:  [phone]   Remember:  Follow the diet and prep instructions given to you by the office.                 Take these medicines the morning of surgery with A SIP OF WATER  Xanax (if needed), wellbutrin, mobic (if needed).   Use your inhalers before you come and bring your rescue inhaler with you.     Please brush your teeth.              DO NOT wear jewelry, make-up or nail polish.  Do not wear lotions, powders, or perfumes, or deodorant.  Do not shave 48 hours prior to surgery.  Men may shave face and neck.  Do not bring valuables to the hospital.  [facility] is not responsible for any belongings or valuables.  Contacts, dentures or bridgework may not be worn into surgery.  Leave your suitcase in the car.  After surgery it may be brought to your room.  For patients admitted to the hospital, discharge time will be determined by your treatment team.  Patients discharged the day of surgery will not be allowed to drive home and must have someone with them for 24 hours.   Special instructions:  DO NOT smoke tobacco or vape for 24 hours before your procedure.  Please read over the following fact sheets that you were given. Anesthesia Post-op Instructions and Care and Recovery After Surgery       Colonoscopy, Adult, Care After This sheet gives you information about how to care for yourself after your procedure. Your health care provider may also give you more specific instructions. If you have problems or questions, contact your health care provider. What can I expect after the procedure? After the procedure, it is common to have:  A small amount of blood in your stool for 24 hours after the procedure.  Some gas.  Mild cramping or bloating of your  abdomen. Follow these instructions at home: Eating and drinking  Drink enough fluid to keep your urine pale yellow.  Follow instructions from your health care provider about eating or drinking restrictions.  Resume your normal diet as instructed by your health care provider. Avoid heavy or fried foods that are hard to digest.   Activity  Rest as told by your health care provider.  Avoid sitting for a long time without moving. Get up to take short walks every 1-2 hours. This is important to improve blood flow and breathing. Ask for help if you feel weak or unsteady.  Return to your normal activities as told by your health care provider. Ask your health care provider what activities are safe for you. Managing cramping and bloating  Try walking around when you have cramps or feel bloated.  Apply heat to your abdomen as told by your health care provider. Use the heat source that your health care provider recommends, such as a moist heat pack or a heating pad. ? Place a towel between your skin and the heat source. ? Leave the heat on for 20-30 minutes. ? Remove the heat if your skin turns bright red. This is especially important if you are unable to feel pain, heat,  or cold. You may have a greater risk of getting burned.   General instructions  If you were given a sedative during the procedure, it can affect you for several hours. Do not drive or operate machinery until your health care provider says that it is safe.  For the first 24 hours after the procedure: ? Do not sign important documents. ? Do not drink alcohol. ? Do your regular daily activities at a slower pace than normal. ? Eat soft foods that are easy to digest.  Take over-the-counter and prescription medicines only as told by your health care provider.  Keep all follow-up visits as told by your health care provider. This is important. Contact a health care provider if:  You have blood in your stool 2-3 days after the  procedure. Get help right away if you have:  More than a small spotting of blood in your stool.  Large blood clots in your stool.  Swelling of your abdomen.  Nausea or vomiting.  A fever.  Increasing pain in your abdomen that is not relieved with medicine. Summary  After the procedure, it is common to have a small amount of blood in your stool. You may also have mild cramping and bloating of your abdomen.  If you were given a sedative during the procedure, it can affect you for several hours. Do not drive or operate machinery until your health care provider says that it is safe.  Get help right away if you have a lot of blood in your stool, nausea or vomiting, a fever, or increased pain in your abdomen. This information is not intended to replace advice given to you by your health care provider. Make sure you discuss any questions you have with your health care provider. Document Revised: 06/12/2019 Document Reviewed: 01/12/2019 Elsevier Patient Education  2021 [employer] Monitored Anesthesia Care, Care After This sheet gives you information about how to care for yourself after your procedure. Your health care provider may also give you more specific instructions. If you have problems or questions, contact your health care provider. What can I expect after the procedure? After the procedure, it is common to have:  Tiredness.  Forgetfulness about what happened after the procedure.  Impaired judgment for important decisions.  Nausea or vomiting.  Some difficulty with balance. Follow these instructions at home: For the time period you were told by your health care provider:  Rest as needed.  Do not participate in activities where you could fall or become injured.  Do not drive or use machinery.  Do not drink alcohol.  Do not take sleeping pills or medicines that cause drowsiness.  Do not make important decisions or sign legal documents.  Do not take care of  children on your own.      Eating and drinking  Follow the diet that is recommended by your health care provider.  Drink enough fluid to keep your urine pale yellow.  If you vomit: ? Drink water, juice, or soup when you can drink without vomiting. ? Make sure you have little or no nausea before eating solid foods. General instructions  Have a responsible adult stay with you for the time you are told. It is important to have someone help care for you until you are awake and alert.  Take over-the-counter and prescription medicines only as told by your health care provider.  If you have sleep apnea, surgery and certain medicines can increase your risk for breathing problems. Follow instructions from  your health care provider about wearing your sleep device: ? Anytime you are sleeping, including during daytime naps. ? While taking prescription pain medicines, sleeping medicines, or medicines that make you drowsy.  Avoid smoking.  Keep all follow-up visits as told by your health care provider. This is important. Contact a health care provider if:  You keep feeling nauseous or you keep vomiting.  You feel light-headed.  You are still sleepy or having trouble with balance after 24 hours.  You develop a rash.  You have a fever.  You have redness or swelling around the IV site. Get help right away if:  You have trouble breathing.  You have new-onset confusion at home. Summary  For several hours after your procedure, you may feel tired. You may also be forgetful and have poor judgment.  Have a responsible adult stay with you for the time you are told. It is important to have someone help care for you until you are awake and alert.  Rest as told. Do not drive or operate machinery. Do not drink alcohol or take sleeping pills.  Get help right away if you have trouble breathing, or if you suddenly become confused. This information is not intended to replace advice given to you  by your health care provider. Make sure you discuss any questions you have with your health care provider. Document Revised: 03/03/2020 Document Reviewed: 05/21/2019 Elsevier Patient Education  2021 [employer].

## 2020-10-18 ENCOUNTER — Telehealth: Payer: Self-pay | Admitting: Internal Medicine

## 2020-10-18 NOTE — Telephone Encounter (Signed)
LMOVM for pt 

## 2020-10-18 NOTE — Telephone Encounter (Signed)
LMOVM for pt to call back 

## 2020-10-18 NOTE — Telephone Encounter (Signed)
Patient called and stated she is sick and can not do her colonoscopy, please call to get her rescheduled

## 2020-10-20 ENCOUNTER — Telehealth: Payer: Self-pay | Admitting: *Deleted

## 2020-10-20 ENCOUNTER — Encounter (HOSPITAL_COMMUNITY): Payer: Self-pay

## 2020-10-20 ENCOUNTER — Encounter (HOSPITAL_COMMUNITY)
Admission: RE | Admit: 2020-10-20 | Discharge: 2020-10-20 | Disposition: A | Discharge status: Home / Self Care | Payer: 59 | Source: Ambulatory Visit | Attending: Internal Medicine | Admitting: Internal Medicine

## 2020-10-20 ENCOUNTER — Other Ambulatory Visit (HOSPITAL_COMMUNITY): Payer: 59 | Attending: Internal Medicine

## 2020-10-20 NOTE — Telephone Encounter (Signed)
-----   Message from Jethro Bolus, RN sent at 10/20/2020  1:29 PM EDT ----- Regarding: No Show  Hey Sofija Antwi! Penelope Galas did not show up for her PAT and Covid test.  We attempted to contact her but we didn't get an answer.

## 2020-10-20 NOTE — Telephone Encounter (Signed)
See prior notes. We have tried calling patient several times. Letter mailed to call Procedure cancelled for monday

## 2020-10-22 ENCOUNTER — Other Ambulatory Visit: Payer: Self-pay | Admitting: Emergency Medicine

## 2020-10-24 ENCOUNTER — Ambulatory Visit (HOSPITAL_COMMUNITY): Admission: RE | Admit: 2020-10-24 | Payer: 59 | Source: Home / Self Care | Admitting: Internal Medicine

## 2020-10-24 ENCOUNTER — Encounter (HOSPITAL_COMMUNITY): Admission: RE | Payer: Self-pay | Source: Home / Self Care

## 2020-10-24 SURGERY — COLONOSCOPY WITH PROPOFOL
Anesthesia: Monitor Anesthesia Care

## 2020-11-08 ENCOUNTER — Other Ambulatory Visit: Payer: Self-pay

## 2020-11-08 ENCOUNTER — Ambulatory Visit
Admission: RE | Admit: 2020-11-08 | Discharge: 2020-11-08 | Disposition: A | Discharge status: Home / Self Care | Payer: 59 | Source: Ambulatory Visit | Attending: Family Medicine | Admitting: Family Medicine

## 2020-11-08 ENCOUNTER — Ambulatory Visit (INDEPENDENT_AMBULATORY_CARE_PROVIDER_SITE_OTHER): Payer: 59

## 2020-11-08 VITALS — BP 153/82 | HR 66 | Temp 97.8°F | Resp 20

## 2020-11-08 DIAGNOSIS — J441 Chronic obstructive pulmonary disease with (acute) exacerbation: Secondary | ICD-10-CM

## 2020-11-08 DIAGNOSIS — R059 Cough, unspecified: Secondary | ICD-10-CM | POA: Diagnosis not present

## 2020-11-08 DIAGNOSIS — I10 Essential (primary) hypertension: Secondary | ICD-10-CM | POA: Diagnosis not present

## 2020-11-08 DIAGNOSIS — Z8616 Personal history of COVID-19: Secondary | ICD-10-CM | POA: Diagnosis not present

## 2020-11-08 DIAGNOSIS — R0602 Shortness of breath: Secondary | ICD-10-CM | POA: Diagnosis not present

## 2020-11-08 MED ORDER — METHYLPREDNISOLONE SODIUM SUCC 125 MG IJ SOLR
125.0000 mg | Freq: Once | INTRAMUSCULAR | Status: AC
  Administered 2020-11-08: 125 mg via INTRAMUSCULAR

## 2020-11-08 MED ORDER — ALBUTEROL SULFATE 2.5 MG/0.5ML IN NEBU: 2.5000 mg | INHALATION_SOLUTION | RESPIRATORY_TRACT | 1 refills | Status: DC | PRN

## 2020-11-08 MED ORDER — AZITHROMYCIN 250 MG PO TABS: 250.0000 mg | ORAL_TABLET | Freq: Every day | ORAL | 0 refills | Status: DC

## 2020-11-08 MED ORDER — PREDNISONE 10 MG (21) PO TBPK: ORAL_TABLET | Freq: Every day | ORAL | 0 refills | Status: AC

## 2020-11-08 NOTE — ED Triage Notes (Signed)
Pt presents with c/o nasal congestion that began last week, and then developed worsening cough, has h/o copd

## 2020-11-08 NOTE — ED Provider Notes (Signed)
[facility]    CSN: [id number] Arrival date & time: 11/08/20  1049      History   Chief Complaint Chief Complaint  Patient presents with  . Cough  . Nasal Congestion    HPI Natasha [name] is a 60 y.o. female.   Reports nasal congestion and cough that began last week.  States that she had worsening cough x3 days ago.  Has history of COPD.  States that she has been taking Mucinex at home and using her inhaler.  Denies sick contacts.  Has positive history of COVID.  Has completed COVID vaccines, no booster.  Has completed flu vaccine.  Denies chills, abdominal pain, nausea, vomiting, diarrhea, rash, fever, other symptoms.  ROS per HPI  The history is provided by the patient.  Cough   Past Medical History:  Diagnosis Date  . Arthritis   . COPD (chronic obstructive pulmonary disease) (HCC)   . DDD (degenerative disc disease)   . DDD (degenerative disc disease), lumbosacral   . Depression   . Hemorrhoids   . Hyperlipidemia   . Hypertension   . Vitamin D deficiency     Patient Active Problem List   Diagnosis Date Noted  . Encounter for screening colonoscopy 07/12/2020  . Polypharmacy 07/12/2020  . COPD (chronic obstructive pulmonary disease) (HCC) 10/30/2019  . Degeneration of spine 11/12/2018  . Hyperlipidemia 11/12/2018  . Ataxia 01/29/2016  . Chest pain 08/10/2015  . Dyspnea 08/10/2015  . Chronic bronchitis (HCC) 08/10/2015  . Tobacco abuse 08/10/2015  . Essential hypertension 08/10/2015  . Dyslipidemia 08/10/2015  . Chest pain with low risk for cardiac etiology 07/01/2015  . ABDOMINAL BLOATING 12/13/2009  . DIARRHEA 12/13/2009  . CHANGE IN BOWELS 12/13/2009  . BURSITIS, SHOULDER 06/19/2007  . HTN (hypertension) 06/19/2007    Past Surgical History:  Procedure Laterality Date  . CERVICAL DISC SURGERY  12/2010   Dr. Franky Machoabbell  . COLONOSCOPY  2011   Rourk: Pancolonic diverticulosis  . MOUTH SURGERY    . TONSILLECTOM[place]     [place] teenager, in [place]   . TRIGGER FINGER RELEASE Left 04/16/2013   Procedure: RELEASE TRIGGER FINGER/A-1 PULLEY LEFT THUMB;  Surgeon: Wyn Forsterobert V Sypher Jr., MD;  Location: [facility];  Service: Orthopedics;  Laterality: Left;  Marland Kitchen. VAGINAL HYSTERECTOMY  06/2000   partial, Dr. Jeanine LuzSteven Fore    OB History   No obstetric history on file.      Home Medications    Prior to Admission medications   Medication Sig Start Date End Date Taking? Authorizing Provider  Albuterol Sulfate 2.5 MG/0.5ML NEBU Inhale 0.5 mLs (2.5 mg total) into the lungs every 4 (four) hours as needed (wheezing). 11/08/20  Yes Moshe CiproMatthews, Emagene Merfeld, NP  azithromycin (ZITHROMAX) 250 MG tablet Take 1 tablet (250 mg total) by mouth daily. Take first 2 tablets together, then 1 every day until finished. 11/08/20  Yes Moshe CiproMatthews, Jackelyn Illingworth, NP  predniSONE (STERAPRED UNI-PAK 21 TAB) 10 MG (21) TBPK tablet Take by mouth daily for 6 days. Take 6 tablets on day 1, 5 tablets on day 2, 4 tablets on day 3, 3 tablets on day 4, 2 tablets on day 5, 1 tablet on day 6 11/08/20 11/14/20 Yes Moshe CiproMatthews, Perseus Westall, NP  acetaminophen (TYLENOL) 500 MG tablet Take 1,000 mg by mouth every 6 (six) hours as needed.    [provider]  ALPRAZolam Prudy Feeler(XANAX) 0.25 MG tablet Take 0.25 mg by mouth daily as needed. 08/10/19   [provider]  BIOTIN PO Take by mouth daily.    [provider]  bisoprolol-hydrochlorothiazide (ZIAC) 10-6.25 MG tablet Take 1 tablet by mouth daily.    [provider]  Boswellia-Glucosamine-Vit D (GLUCOSAMINE COMPLEX PO) Take 1 tablet by mouth daily.    [provider]  buPROPion (WELLBUTRIN SR) 150 MG 12 hr tablet Take 150 mg by mouth. Takes 1 tablet every 3 days (trying to stop med)    [provider]  CALCIUM PO Take by mouth 2 (two) times daily.    [provider]  Evening Primrose Oil 500 MG CAPS evening primrose oil 500 mg capsule  Take by oral route.    [provider]   fluticasone (FLONASE) 50 MCG/ACT nasal spray Place 1 spray into both nostrils daily for 14 days. Patient taking differently: Place 1 spray into both nostrils as needed. 05/30/20 06/13/20  Avegno, Zachery Dakins, FNP  GINSENG PO Take by mouth daily.    [provider]  hydrochlorothiazide (MICROZIDE) 12.5 MG capsule Take 12.5 mg by mouth as needed.    [provider]  losartan (COZAAR) 50 MG tablet Take 50 mg by mouth daily.    [provider]  meloxicam (MOBIC) 15 MG tablet Take 1 tablet (15 mg total) by mouth daily. Patient taking differently: Take 15 mg by mouth as needed. 10/19/19   Felecia Shelling, DPM  Misc Natural Products (LEG VEIN & CIRCULATION PO) Take by mouth 2 (two) times daily.    [provider]  MULTIPLE VITAMIN PO Take 1 tablet by mouth daily.     [provider]  Nutritional Supplements (ANTIOXIDANTS PO) Take by mouth daily.    [provider]  Omega-3 Fatty Acids (FISH OIL PO) Take by mouth 2 (two) times daily.    [provider]  rosuvastatin (CRESTOR) 10 MG tablet rosuvastatin 10 mg tablet  Take 1 tablet every day by oral route. 05/06/18   [provider]  Saw Palmetto, Serenoa repens, (SAW PALMETTO PO) Take by mouth daily.    [provider]  STIOLTO RESPIMAT 2.5-2.5 MCG/ACT AERS INHALE 2 PUFFS INTO THE LUNGS DAILY 10/24/20   Leslye Peer, MD    Family History Family History  Problem Relation Age of Onset  . COPD Mother   . Stroke Mother   . Hypertension Mother   . Hyperlipidemia Mother   . Depression Mother   . Hypertension Father   . Diabetes Father   . Cancer Father   . Hyperlipidemia Father   . Alcohol abuse Father   . Hyperlipidemia Brother   . Cancer Maternal Grandmother   . Stroke Paternal Grandmother   . Diabetes Other   . Colitis Other     Social History Social History   Tobacco Use  . Smoking status: Former Smoker    Packs/day: 1.00    Years: 35.00    Pack years:  35.00    Types: Cigarettes    Quit date: 08/01/2018    Years since quitting: 2.2  . Smokeless tobacco: Never Used  . Tobacco comment: takes a puff from time to time  Substance Use Topics  . Alcohol use: Yes    Comment: 2-3 times weekly-couple beers/couple drinks at a time  . Drug use: No     Allergies   Patient has no known allergies.   Review of Systems Review of Systems  Respiratory: Positive for cough.      Physical Exam Triage Vital Signs ED Triage Vitals  Enc Vitals  Group     BP 11/08/20 1149 (!) 153/82     Pulse Rate 11/08/20 1149 66     Resp 11/08/20 1149 20     Temp 11/08/20 1149 97.8 F (36.6 C)     Temp src --      SpO2 11/08/20 1149 91 %     Weight --      Height --      Head Circumference --      Peak Flow --      Pain Score 11/08/20 1148 0     Pain Loc --      Pain Edu? --      Excl. in GC? --    No data found.  Updated Vital Signs BP (!) 153/82   Pulse 66   Temp 97.8 F (36.6 C)   Resp 20   SpO2 91%       Physical Exam Vitals and nursing note reviewed.  Constitutional:      General: She is not in acute distress.    Appearance: She is well-developed. She is ill-appearing.  HENT:     Head: Normocephalic and atraumatic.     Right Ear: Tympanic membrane, ear canal and external ear normal.     Left Ear: Tympanic membrane, ear canal and external ear normal.     Mouth/Throat:     Mouth: Mucous membranes are moist.     Pharynx: Posterior oropharyngeal erythema present.  Eyes:     Extraocular Movements: Extraocular movements intact.     Conjunctiva/sclera: Conjunctivae normal.     Pupils: Pupils are equal, round, and reactive to light.  Cardiovascular:     Rate and Rhythm: Normal rate and regular rhythm.     Heart sounds: Normal heart sounds. No murmur heard.   Pulmonary:     Effort: Pulmonary effort is normal. No respiratory distress.     Breath sounds: No stridor. Wheezing and rhonchi present. No rales.     Comments: Distant lung  sounds Chest:     Chest wall: No tenderness.  Abdominal:     Palpations: Abdomen is soft.     Tenderness: There is no abdominal tenderness.  Musculoskeletal:     Cervical back: Normal range of motion and neck supple.  Lymphadenopathy:     Cervical: Cervical adenopathy present.  Skin:    General: Skin is warm and dry.     Capillary Refill: Capillary refill takes less than 2 seconds.  Neurological:     General: No focal deficit present.     Mental Status: She is alert and oriented to person, place, and time.  Psychiatric:        Mood and Affect: Mood normal.        Behavior: Behavior normal.        Thought Content: Thought content normal.      UC Treatments / Results  Labs (all labs ordered are listed, but only abnormal results are displayed) Labs Reviewed - No data to display  EKG   Radiology DG Chest 2 View  Result Date: 11/08/2020 CLINICAL DATA:  Cough and shortness of breath for 1 week, history COPD, hypertension EXAM: CHEST - 2 VIEW COMPARISON:  01/29/2016 FINDINGS: Normal heart size, mediastinal contours, and pulmonary vascularity. None Atherosclerotic calcification aorta. Minimal atelectasis or scarring at RIGHT base. Underlying emphysematous and bronchitic changes consistent with COPD. No acute infiltrate, pleural effusion or pneumothorax. Prior cervical spine fusion. IMPRESSION: COPD changes with minimal atelectasis or scarring at RIGHT base. No acute  abnormalities. Aortic Atherosclerosis (ICD10-I70.0) and Emphysema (ICD10-J43.9). Electronically Signed   By: Ulyses Southward M.D.   On: 11/08/2020 12:17    Procedures Procedures (including critical care time)  Medications Ordered in UC Medications  methylPREDNISolone sodium succinate (SOLU-MEDROL) 125 mg/2 mL injection 125 mg (125 mg Intramuscular Given 11/08/20 1257)    Initial Impression / Assessment and Plan / UC Course  I have reviewed the triage vital signs and the nursing notes.  Pertinent labs & imaging results  that were available during my care of the patient were reviewed by me and considered in my medical decision making (see chart for details).    COPD exacerbation  Prescribed azithromycin Prescribe steroid taper Prescribed albuterol nebulizer solution for machine at home Take as directed and to completion Solu-Medrol 125 mg IM given in office today Chest x-ray negative for pneumonia Follow up with this office or with primary care if symptoms are persisting.  Follow up in the ER for high fever, trouble swallowing, trouble breathing, other concerning symptoms.   Final Clinical Impressions(s) / UC Diagnoses   Final diagnoses:  COPD exacerbation (HCC)     Discharge Instructions     I have sent in azithromycin for you to take. Take 2 tablets today, then one tablet daily for the next 4 days.  I have sent in a prednisone taper for you to take for 6 days. 6 tablets on day one, 5 tablets on day two, 4 tablets on day three, 3 tablets on day four, 2 tablets on day five, and 1 tablet on day six.  You have received a steroid injection in the office today to help open you up.      ED Prescriptions    Medication Sig Dispense Auth. Provider   predniSONE (STERAPRED UNI-PAK 21 TAB) 10 MG (21) TBPK tablet Take by mouth daily for 6 days. Take 6 tablets on day 1, 5 tablets on day 2, 4 tablets on day 3, 3 tablets on day 4, 2 tablets on day 5, 1 tablet on day 6 21 tablet Moshe Cipro, NP   azithromycin (ZITHROMAX) 250 MG tablet Take 1 tablet (250 mg total) by mouth daily. Take first 2 tablets together, then 1 every day until finished. 6 tablet Moshe Cipro, NP   Albuterol Sulfate 2.5 MG/0.5ML NEBU Inhale 0.5 mLs (2.5 mg total) into the lungs every 4 (four) hours as needed (wheezing). 360 mL Moshe Cipro, NP     I have reviewed the PDMP during this encounter.   Moshe Cipro, NP 11/08/20 1310

## 2020-11-08 NOTE — Discharge Instructions (Addendum)
I have sent in azithromycin for you to take. Take 2 tablets today, then one tablet daily for the next 4 days.  I have sent in a prednisone taper for you to take for 6 days. 6 tablets on day one, 5 tablets on day two, 4 tablets on day three, 3 tablets on day four, 2 tablets on day five, and 1 tablet on day six.  You have received a steroid injection in the office today to help open you up.

## 2020-11-30 ENCOUNTER — Other Ambulatory Visit: Payer: Self-pay | Admitting: Emergency Medicine

## 2020-11-30 ENCOUNTER — Telehealth: Payer: Self-pay | Admitting: Emergency Medicine

## 2020-12-05 ENCOUNTER — Ambulatory Visit: Payer: 59 | Admitting: Emergency Medicine

## 2020-12-08 NOTE — Telephone Encounter (Signed)
Nothing noted in message. Will close encounter.  

## 2020-12-13 ENCOUNTER — Encounter: Payer: Self-pay | Admitting: Emergency Medicine

## 2020-12-13 ENCOUNTER — Ambulatory Visit (INDEPENDENT_AMBULATORY_CARE_PROVIDER_SITE_OTHER): Payer: 59 | Admitting: Emergency Medicine

## 2020-12-13 ENCOUNTER — Other Ambulatory Visit: Payer: Self-pay

## 2020-12-13 DIAGNOSIS — Z720 Tobacco use: Secondary | ICD-10-CM | POA: Diagnosis not present

## 2020-12-13 DIAGNOSIS — J449 Chronic obstructive pulmonary disease, unspecified: Secondary | ICD-10-CM | POA: Diagnosis not present

## 2020-12-13 MED ORDER — STIOLTO RESPIMAT 2.5-2.5 MCG/ACT IN AERS: 2.0000 | INHALATION_SPRAY | Freq: Every day | RESPIRATORY_TRACT | 0 refills | Status: DC

## 2020-12-13 NOTE — Assessment & Plan Note (Signed)
Very rarely will have a puff, overall has stopped.  Encouraged her to avoid altogether.  She is due for repeat lung cancer screening CT scan in December.

## 2020-12-13 NOTE — Assessment & Plan Note (Addendum)
I reviewed her inhaler formulary.  There is no LABA/LAMA that is in a lower than tier 3.  We will continue Stiolto.  Try to avoid smoking altogether. Continue Stiolto 2 puffs once daily. Keep albuterol available use 2 puffs or 1 nebulizer treatment when needed for shortness of breath, chest tightness, wheezing Follow Dr. Delton Coombes in December 2022 or sooner if you have any problems.

## 2020-12-13 NOTE — Addendum Note (Signed)
Addended by: Dorisann Frames R on: 12/13/2020 02:07 PM   Modules accepted: Orders

## 2020-12-13 NOTE — Patient Instructions (Signed)
Try to avoid smoking altogether. Continue Stiolto 2 puffs once daily. Keep albuterol available use 2 puffs or 1 nebulizer treatment when needed for shortness of breath, chest tightness, wheezing Get your screening CT scan in December 2022 as planned. Follow Dr. Delton Coombes in December 2022 or sooner if you have any problems.

## 2020-12-13 NOTE — Progress Notes (Signed)
Subjective:    Patient ID: Natasha [name], female    DOB: 1960-11-01, 60 y.o.   MRN: [id number]  HPI 60 year old occasional smoker (60 pack years) with a history of hyperlipidemia, hypertension.  She also carries a history of COPD that was made by Dr Juanetta Gosling several yrs ago  She is currently managed on Spiriva Respimat.  She also has albuterol which she uses approximately daily.  She describes an improvement in her cough after she stopped smoking in 07/2018. Has not really seen any improvement in her breathing since then. She does believe that the spiriva helps her. She does not cough much, minimal mucous. Occasional wheeze. COVID vaccine up to date. She has had AE's before but not for several years.   She underwent pulmonary function testing on 08/2019 which I have reviewed, shows severe obstruction with a positive bronchodilator response.  Lung volumes are hyperinflated.  Diffusion capacity is decreased and does not fully correct when adjusted for alveolar volume.  Last LDCT was 06/24/2019 at [facility] > was read as RADS 3, recommended f/u scan in 6 months  ROV 12/13/20 --this follow-up visit for 60 year old woman with a history of tobacco use and COPD. Quit smoking in 2020 - but she takes a random puff sometimes. She has severe obstruction with a positive bronchodilator response by pulmonary function testing.  Last year we tried changing her Spiriva to Stiolto to see if she would get more benefit.  Her personal cost for Stiolto is now approximately $200 per month.  Reviewed her formulary, all comparable meds are Tier 3. She has albuterol, uses most days at once.  She had an acute bronchitis last month - was treated with steroids and abx. Better but still coughs up a small amt phlegm, clear.  Uses flonase prn, not recently  Participates in lung cancer screening program, most recent scan 06/30/2020 reviewed by me, shows multiple small pulmonary nodules similar in size and number to her previous study,  largest 5.7 mm in the left lower lobe, RADS 2S study, with plan for 7-month follow-up   Review of Systems As per HPI  Past Medical History:  Diagnosis Date   Arthritis    COPD (chronic obstructive pulmonary disease) (HCC)    DDD (degenerative disc disease)    DDD (degenerative disc disease), lumbosacral    Depression    Hemorrhoids    Hyperlipidemia    Hypertension    Vitamin D deficiency      Family History  Problem Relation Age of Onset   COPD Mother    Stroke Mother    Hypertension Mother    Hyperlipidemia Mother    Depression Mother    Hypertension Father    Diabetes Father    Cancer Father    Hyperlipidemia Father    Alcohol abuse Father    Hyperlipidemia Brother    Cancer Maternal Grandmother    Stroke Paternal Grandmother    Diabetes Other    Colitis Other      Social History   Socioeconomic History   Marital status: Single    Spouse name: Not on file   Number of children: 0   Years of education: Not on file   Highest education level: Not on file  Occupational History   Occupation: accounting  Tobacco Use   Smoking status: Former    Packs/day: 1.00    Years: 35.00    Pack years: 35.00    Types: Cigarettes    Quit date: 08/01/2018    Years  since quitting: 2.3   Smokeless tobacco: Never   Tobacco comments:    takes a puff from time to time  Vaping Use   Vaping Use: Not on file  Substance and Sexual Activity   Alcohol use: Yes    Comment: 2-3 times weekly-couple beers/couple drinks at a time   Drug use: No   Sexual activity: Yes    Birth control/protection: Post-menopausal  Other Topics Concern   Not on file  Social History Narrative   Epworth Sleepiness Scale = 9 (as of 08/10/2015)   Social Determinants of Health   Financial Resource Strain: Not on file  Food Insecurity: Not on file  Transportation Needs: Not on file  Physical Activity: Not on file  Stress: Not on file  Social Connections: Not on file  Intimate Partner Violence: Not  on file     No Known Allergies   Outpatient Medications Prior to Visit  Medication Sig Dispense Refill   acetaminophen (TYLENOL) 500 MG tablet Take 1,000 mg by mouth every 6 (six) hours as needed.     albuterol (VENTOLIN HFA) 108 (90 Base) MCG/ACT inhaler SMARTSIG:2 Puff(s) Via Inhaler Twice Daily PRN     Albuterol Sulfate 2.5 MG/0.5ML NEBU Inhale 0.5 mLs (2.5 mg total) into the lungs every 4 (four) hours as needed (wheezing). 360 mL 1   ALPRAZolam (XANAX) 0.25 MG tablet Take 0.25 mg by mouth daily as needed.     BIOTIN PO Take by mouth daily.     bisoprolol-hydrochlorothiazide (ZIAC) 10-6.25 MG tablet Take 1 tablet by mouth daily.     Boswellia-Glucosamine-Vit D (GLUCOSAMINE COMPLEX PO) Take 1 tablet by mouth daily.     buPROPion (WELLBUTRIN SR) 150 MG 12 hr tablet Take 150 mg by mouth. Takes 1 tablet every 3 days (trying to stop med)     CALCIUM PO Take by mouth 2 (two) times daily.     Evening Primrose Oil 500 MG CAPS evening primrose oil 500 mg capsule  Take by oral route.     hydrochlorothiazide (MICROZIDE) 12.5 MG capsule Take 12.5 mg by mouth as needed.     losartan (COZAAR) 50 MG tablet Take 50 mg by mouth daily.     meloxicam (MOBIC) 15 MG tablet Take 1 tablet (15 mg total) by mouth daily. (Patient taking differently: Take 15 mg by mouth as needed.) 30 tablet 1   Misc Natural Products (LEG VEIN & CIRCULATION PO) Take by mouth 2 (two) times daily.     MULTIPLE VITAMIN PO Take 1 tablet by mouth daily.      Nutritional Supplements (ANTIOXIDANTS PO) Take by mouth daily.     Omega-3 Fatty Acids (FISH OIL PO) Take by mouth 2 (two) times daily.     rosuvastatin (CRESTOR) 10 MG tablet rosuvastatin 10 mg tablet  Take 1 tablet every day by oral route.     Saw Palmetto, Serenoa repens, (SAW PALMETTO PO) Take by mouth daily.     STIOLTO RESPIMAT 2.5-2.5 MCG/ACT AERS INHALE 2 PUFFS INTO THE LUNGS DAILY 4 g 0   azithromycin (ZITHROMAX) 250 MG tablet Take 1 tablet (250 mg total) by mouth  daily. Take first 2 tablets together, then 1 every day until finished. 6 tablet 0   fluticasone (FLONASE) 50 MCG/ACT nasal spray Place 1 spray into both nostrils daily for 14 days. (Patient taking differently: Place 1 spray into both nostrils as needed.) 16 g 0   GINSENG PO Take by mouth daily.     No facility-administered medications  prior to visit.         Objective:   Physical Exam Vitals:   12/13/20 1337  BP: (!) 142/88  Pulse: 85  Temp: (!) 97.3 F (36.3 C)  SpO2: 99%   Gen: Pleasant, well-nourished, in no distress,  normal affect  ENT: No lesions,  mouth clear,  oropharynx clear, no postnasal drip  Neck: No JVD, no stridor  Lungs: No use of accessory muscles, no crackles or wheezing on normal respiration, she does wheeze on a forced expiration.   Cardiovascular: RRR, heart sounds normal, no murmur or gallops, no peripheral edema  Musculoskeletal: No deformities, no cyanosis or clubbing  Neuro: alert, awake, non focal  Skin: Warm, no lesions or rash     Assessment & Plan:  COPD (chronic obstructive pulmonary disease) (HCC) I reviewed her inhaler formulary.  There is no LABA/LAMA that is in a lower than tier 3.  We will continue Stiolto.  Try to avoid smoking altogether. Continue Stiolto 2 puffs once daily. Keep albuterol available use 2 puffs or 1 nebulizer treatment when needed for shortness of breath, chest tightness, wheezing Follow Dr. Delton Coombes in December 2022 or sooner if you have any problems.  Tobacco abuse Very rarely will have a puff, overall has stopped.  Encouraged her to avoid altogether.  She is due for repeat lung cancer screening CT scan in December.   Levy Pupa, MD, PhD 12/13/2020, 1:54 PM [facility] [phone] or if no answer [phone]

## 2021-01-16 ENCOUNTER — Telehealth: Payer: Self-pay | Admitting: Emergency Medicine

## 2021-01-16 NOTE — Telephone Encounter (Signed)
Patient states that she will be having back surgery, but is unsure when that would be. She is wanting to check with Dr. Delton Coombes to make sure she would be ok being put under anesthesia .   Dr. Delton Coombes please advise

## 2021-01-19 NOTE — Telephone Encounter (Signed)
Please let her know that her COPD puts her at moderate to high risk for general anesthesia - that could mean longer time in the hospital, longer time intubated/ventilated post-op, or even increased risk for severe complications. That said, as long as she is not having flaring symptoms, there is no absolute contraindication for back surgery if she and her surgeon agree that the benefits outweigh these risks.

## 2021-01-19 NOTE — Telephone Encounter (Signed)
Called and spoke with patient, advised of recommendations per Dr. Delton Coombes.  She verbalized understanding.  At this time, she had not scheduled surgery, she has seen Dr. Timothy Lasso her pcp for her yearly physical a week and a half ago and he brought her COPD and surgery to her attention.  She saw Dr. Maeola Harman with [facility] neurology and spine and he recommended surgery.  She has decided to get a second opinion and will see Dr[facility] Sharolyn Douglas [facility]  She wanted to know how to get the risk assessment from our office.  Advised that if she decides to have surgery to let our office know who will do the surgery, the fax number and the procedure that will be done and we will send over the last office note from Dr. Delton Coombes with the risk assessment as an addendum to her visit.  She verbalized understanding.  Nothing further needed.

## 2021-02-15 ENCOUNTER — Other Ambulatory Visit: Payer: Self-pay | Admitting: Emergency Medicine

## 2021-02-16 ENCOUNTER — Telehealth: Payer: Self-pay | Admitting: Emergency Medicine

## 2021-02-16 MED ORDER — STIOLTO RESPIMAT 2.5-2.5 MCG/ACT IN AERS: 2.0000 | INHALATION_SPRAY | Freq: Every day | RESPIRATORY_TRACT | 3 refills | Status: AC

## 2021-02-16 NOTE — Telephone Encounter (Signed)
Pt is requesting a refill for the medication; Stiolto Respimat; stated that her pharmacy has sent a request already for the medication as well but she is completely out.   Pharmacy; [facility] [phone] - [place], [place] - [address] AT [facility] [address]  [address], [place] [address]   Pls regard; [phone]

## 2021-02-16 NOTE — Telephone Encounter (Signed)
Call made to patient, confirmed DOB. Confirmed mediation and pharmacy. Refill sent. Voiced understanding.   Nothing further needed at this time.

## 2021-02-27 ENCOUNTER — Other Ambulatory Visit: Payer: Self-pay

## 2021-02-27 ENCOUNTER — Encounter (HOSPITAL_COMMUNITY): Payer: Self-pay | Admitting: Physical Therapy

## 2021-02-27 ENCOUNTER — Ambulatory Visit (HOSPITAL_COMMUNITY): Payer: 59 | Attending: Orthopaedic Surgery | Admitting: Physical Therapy

## 2021-02-27 DIAGNOSIS — G8929 Other chronic pain: Secondary | ICD-10-CM | POA: Insufficient documentation

## 2021-02-27 DIAGNOSIS — M5441 Lumbago with sciatica, right side: Secondary | ICD-10-CM | POA: Insufficient documentation

## 2021-02-27 DIAGNOSIS — R262 Difficulty in walking, not elsewhere classified: Secondary | ICD-10-CM | POA: Insufficient documentation

## 2021-02-27 DIAGNOSIS — M6281 Muscle weakness (generalized): Secondary | ICD-10-CM | POA: Diagnosis present

## 2021-02-27 NOTE — Therapy (Signed)
[facility] [address] [place], [place], [address] Phone: [phone]   Fax:  [phone]  Physical Therapy Evaluation  Patient Details  Name: Natasha [name] MRN: [id number] Date of Birth: 1960/07/16 Referring Provider (PT): Sharolyn Douglas, MD   Encounter Date: 02/27/2021   PT End of Session - 02/27/21 1002     Visit Number 1    Number of Visits 12    Date for PT Re-Evaluation 04/10/21    Authorization Type [facility] VL PT/OT/[address], no auth    Authorization - Visit Number 1    Authorization - Number of Visits 30    Progress Note Due on Visit 10    PT Start Time 1003    PT Stop Time 1037    PT Time Calculation (min) 34 min    Activity Tolerance Patient tolerated treatment well             Past Medical History:  Diagnosis Date   Arthritis    COPD (chronic obstructive pulmonary disease) (HCC)    DDD (degenerative disc disease)    DDD (degenerative disc disease), lumbosacral    Depression    Hemorrhoids    Hyperlipidemia    Hypertension    Vitamin D deficiency     Past Surgical History:  Procedure Laterality Date   CERVICAL DISC SURGERY  12/2010   Dr. Franky Macho   COLONOSCOPY  2011   Rourk: Pancolonic diverticulosis   MOUTH SURGERY     TONSILLECTOMY     as teenager, in [place]   [place] FINGER RELEASE Left 04/16/2013   Procedure: RELEASE TRIGGER FINGER/A-1 PULLEY LEFT THUMB;  Surgeon: Wyn Forster., MD;  Location: [facility];  Service: Orthopedics;  Laterality: Left;   VAGINAL HYSTERECTOMY  06/2000   partial, Dr. Jeanine Luz    There were no vitals filed for this visit.    Subjective Assessment - 02/27/21 1011     Subjective Reports about 2 weeks ago she fell and hurt her knee as she fell up the stairs with her flip flops. Reports she is hear as in back of the middle of June she was having some mild back pain but those resolved and then she picked up two cases of water and afterwards that  next day she has been having severe right hip pain. States she couldn't even stand for 2 minutes as her pain was so bad. States she went to the MD and they gave her pain pills and muscle relaxers.  Reports MRI demonstrated bulging disc to the right of her lumbar spine. States first MD wanted to do surgery and she got a second opinion. States since then she has had a reduction in hip pain and still has some weakness on the right. States that her second opinion recommended PT as surgery is the last thing she wants.    Pertinent History DDD, COPD    Limitations Lifting;Standing;Walking;House hold activities    Diagnostic tests MRI - buldging disc on R    Patient Stated Goals to have less pain and avoid surgery    Currently in Pain? Yes    Pain Score 3     Pain Location Hip    Pain Orientation Right;Lower    Pain Descriptors / Indicators Aching;Dull    Pain Type Acute pain    Pain Radiating Towards in buttocks    Pain Onset More than a month ago    Pain Frequency Intermittent  Aggravating Factors  standing walking    Pain Relieving Factors rest, pain meds                [facility] PT Assessment - 02/27/21 0001       Assessment   Medical Diagnosis LBP    Referring Provider (PT) Sharolyn DouglasMax Cohen, MD    Onset Date/Surgical Date --   in june of this year   Prior Therapy for LBP      Restrictions   Weight Bearing Restrictions No      Balance Screen   Has the patient fallen in the past 6 months Yes    How many times? 1   falling up stairs   Has the patient had a decrease in activity level because of a fear of falling?  No    Is the patient reluctant to leave their home because of a fear of falling?  No      Home Nurse, mental healthnvironment   Living Environment Private residence    Living Arrangements Alone    Available Help at Discharge --   none   Type of Home House      Prior Function   Level of Independence Independent      Cognition   Overall Cognitive Status Within Functional Limits for tasks  assessed      Observation/Other Assessments   Observations wound on right knee - slough covered and redness surrounding but less then 2cm from wound bed, left lateral shift standing in lumbar spine    Focus on Therapeutic Outcomes (FOTO)  56% function      ROM / Strength   AROM / PROM / Strength AROM;Strength      AROM   AROM Assessment Site Lumbar    Lumbar Flexion 25% limited   favors left side   Lumbar Extension 75% limited   no change in symptoms   Lumbar - Right Side Bend 25% limited   no change in symptoms   Lumbar - Left Side Bend 25% limited   no change in symptoms     Strength   Strength Assessment Site Hip;Knee;Ankle    Right/Left Hip Right;Left    Right Hip Flexion 3+/5    Right Hip Extension 3+/5    Left Hip Flexion 4-/5    Left Hip Extension 4-/5    Right/Left Knee Right;Left    Right Knee Flexion 3+/5   pain in buttocks   Right Knee Extension 4-/5    Left Knee Flexion 4-/5    Left Knee Extension 4/5      Palpation   Spinal mobility hypomobility noted in lumbar spine    Palpation comment no tenderness in low back or hips noted      Special Tests    Special Tests Lumbar    Lumbar Tests other      other   Comments repeated lumbar extension prone x10- slight tension in low back - no leg symptoms                         Objective measurements completed on examination: See above findings.       [facility] - 02/27/21 0001       Exercises   Exercises Lumbar                    PT Education - 02/27/21 1040     Education Details on current presentation, on POC, on repeated movements and how  this can reduce buldging disc    Person(s) Educated Patient    Methods Explanation    Comprehension Verbalized understanding              PT Short Term Goals - 02/27/21 1017       PT SHORT TERM GOAL #1   Title Patient will report at least 50% improvement in overall symptoms and/or function to demonstrate  improved functional mobility    Time 3    Period Weeks    Status New    Target Date 03/20/21      PT SHORT TERM GOAL #2   Title Patient will have no increase inhippain with MMT    Time 3    Period Weeks    Status New    Target Date 03/20/21      PT SHORT TERM GOAL #3   Title Patient will be independent in self management strategies to improve quality of life and functional outcomes.    Time 3    Period Weeks    Status New    Target Date 03/20/21               PT Long Term Goals - 02/27/21 1018       PT LONG TERM GOAL #1   Title Patient will report at least 75% improvement in overall symptoms and/or function to demonstrate improved functional mobility    Time 6    Period Weeks    Status New    Target Date 04/10/21      PT LONG TERM GOAL #2   Title Patient will mee predicted FOTO score to demonstrate improved overall function.    Time 6    Period Weeks    Status New    Target Date 04/10/21      PT LONG TERM GOAL #3   Title Patient will dmeonstrate good lifting mechanics to reduce risk of further injury    Time 6    Period Weeks    Status New    Target Date 04/10/21                    Plan - 02/27/21 1029     Clinical Impression Statement Patient is a 60 y.o. female who presents to physical therapy with complaint of right radicular symptoms into hip after lifting 2 cases of water. Initial pain intensity has resolved but patient continues to have pain and demonstrates decreased strength, ROM restriction, balance deficits and gait abnormalities which are likely contributing to symptoms of pain and are negatively impacting patient ability to perform ADLs and functional mobility tasks. Patient will benefit from skilled physical therapy services to address these deficits to reduce pain, improve level of function with ADLs, functional mobility tasks, and reduce risk for falls.    Personal Factors and Comorbidities Comorbidity 1;Comorbidity 2    Comorbidities  DDD chronic, COPD    Examination-Activity Limitations Bend;Squat;Stand;Locomotion Level    Examination-Participation Restrictions Meal Prep;Driving;Community Activity;Cleaning    Stability/Clinical Decision Making Stable/Uncomplicated    Clinical Decision Making Low    Rehab Potential Good    PT Frequency 2x / week    PT Duration 6 weeks    PT Treatment/Interventions ADLs/Self Care Home Management;Aquatic Therapy;Cryotherapy;Electrical Stimulation;Moist Heat;Traction;Balance training;Therapeutic exercise;Therapeutic activities;Manual techniques;Gait training;Neuromuscular re-education;DME Instruction;Patient/family education;Joint Manipulations;Spinal Manipulations    PT Next Visit Plan f/u with extension and trial offe center extension/ movements to address left latereal shift, trial traction, core and hip strength, eventually add in  basic  yoga moves per pt request and lifting mechanics.    PT Home Exercise Plan prone lumbar extension.    Consulted and Agree with Plan of Care Patient             Patient will benefit from skilled therapeutic intervention in order to improve the following deficits and impairments:  Pain, Difficulty walking, Decreased range of motion, Decreased strength, Decreased activity tolerance, Decreased mobility, Decreased balance, Hypomobility, Postural dysfunction  Visit Diagnosis: Chronic midline low back pain with right-sided sciatica  Muscle weakness (generalized)  Difficulty in walking, not elsewhere classified     Problem List Patient Active Problem List   Diagnosis Date Noted   Encounter for screening colonoscopy 07/12/2020   Polypharmacy 07/12/2020   COPD (chronic obstructive pulmonary disease) (HCC) 10/30/2019   Degeneration of spine 11/12/2018   Hyperlipidemia 11/12/2018   Ataxia 01/29/2016   Chest pain 08/10/2015   Dyspnea 08/10/2015   Chronic bronchitis (HCC) 08/10/2015   Tobacco abuse 08/10/2015   Essential hypertension 08/10/2015    Dyslipidemia 08/10/2015   Chest pain with low risk for cardiac etiology 07/01/2015   ABDOMINAL BLOATING 12/13/2009   DIARRHEA 12/13/2009   CHANGE IN BOWELS 12/13/2009   BURSITIS, SHOULDER 06/19/2007   HTN (hypertension) 06/19/2007   10:41 AM, 02/27/21 Tereasa Coop, DPT Physical Therapy with [facility]  [phone] office   [facility] [address] [place], [place], [address] Phone: [phone]   Fax:  [phone]  Name: Natasha [name] MRN: [id number] Date of Birth: 1960-10-24

## 2021-03-08 ENCOUNTER — Ambulatory Visit (HOSPITAL_COMMUNITY): Payer: 59 | Attending: Orthopaedic Surgery | Admitting: Physical Therapy

## 2021-03-08 ENCOUNTER — Other Ambulatory Visit: Payer: Self-pay

## 2021-03-08 DIAGNOSIS — M6281 Muscle weakness (generalized): Secondary | ICD-10-CM | POA: Insufficient documentation

## 2021-03-08 DIAGNOSIS — M5441 Lumbago with sciatica, right side: Secondary | ICD-10-CM | POA: Insufficient documentation

## 2021-03-08 DIAGNOSIS — G8929 Other chronic pain: Secondary | ICD-10-CM | POA: Diagnosis present

## 2021-03-08 DIAGNOSIS — R262 Difficulty in walking, not elsewhere classified: Secondary | ICD-10-CM | POA: Insufficient documentation

## 2021-03-08 NOTE — Therapy (Addendum)
[facility] [address] [place], [place], [address] Phone: [phone]   Fax:  [phone]  Physical Therapy Treatment and Discharge Note  Patient Details  Name: Natasha [name] MRN: [id number] Date of Birth: 1960-08-14 Referring Provider (PT): Sharolyn Douglas, MD  PHYSICAL THERAPY DISCHARGE SUMMARY  Visits from Start of Care: 2  Current functional level related to goals / functional outcomes: Unable to assess due to unplanned discharge  Remaining deficits: Unable to assess due to unplanned discharge   Education / Equipment: Unable to assess due to unplanned discharge  Patient agrees to discharge. Patient goals were  unable to assess . Patient is being discharged due to the patient's request.  8:49 AM, 03/10/21 Tereasa Coop, DPT Physical Therapy with [facility]  [phone] office    Encounter Date: 03/08/2021   PT End of Session - 03/08/21 0924     Visit Number 2    Number of Visits 12    Date for PT Re-Evaluation 04/10/21    Authorization Type [employer] 30 VL PT/OT/[address], no auth    Authorization - Visit Number 2    Authorization - Number of Visits 30    Progress Note Due on Visit 10    PT Start Time 0839    PT Stop Time 0914    PT Time Calculation (min) 35 min    Activity Tolerance Patient tolerated treatment well             Past Medical History:  Diagnosis Date   Arthritis    COPD (chronic obstructive pulmonary disease) (HCC)    DDD (degenerative disc disease)    DDD (degenerative disc disease), lumbosacral    Depression    Hemorrhoids    Hyperlipidemia    Hypertension    Vitamin D deficiency     Past Surgical History:  Procedure Laterality Date   CERVICAL DISC SURGERY  12/2010   Dr. Franky Macho   COLONOSCOPY  2011   Rourk: Pancolonic diverticulosis   MOUTH SURGERY     TONSILLECTOMY     as teenager, in [place] FINGER RELEASE Left 04/16/2013   Procedure: RELEASE  TRIGGER FINGER/A-1 PULLEY LEFT THUMB;  Surgeon: Wyn Forster., MD;  Location: [facility];  Service: Orthopedics;  Laterality: Left;   VAGINAL HYSTERECTOMY  06/2000   partial, Dr. Jeanine Luz    There were no vitals filed for this visit.   Subjective Assessment - 03/08/21 0845     Subjective pt states her symptoms are alot better than they were.  Reports the pain meds and mm relaxers really helped.  States she still feels weak in her Rt LE.  No other issues reported.    Currently in Pain? Yes    Pain Score 2     Pain Location Hip    Pain Orientation Right;Lower    Pain Descriptors / Indicators Aching                               [facility] Adult PT Treatment/Exercise - 03/08/21 0001       Lumbar Exercises: Stretches   Active Hamstring Stretch Right;Left;3 reps;30 seconds;Limitations    Active Hamstring Stretch Limitations longsitting    Piriformis Stretch Right;Left;3 reps;30 seconds    Piriformis Stretch Limitations seated      Lumbar Exercises: Supine   Ab Set 10 reps    AB Set Limitations 5  second holds    Bridge 10 reps    Straight Leg Raise 10 reps                  Upper Extremity Functional Index Score :   /80     PT Short Term Goals - 03/08/21 0907       PT SHORT TERM GOAL #1   Title Patient will report at least 50% improvement in overall symptoms and/or function to demonstrate improved functional mobility    Time 3    Period Weeks    Status On-going    Target Date 03/20/21      PT SHORT TERM GOAL #2   Title Patient will have no increase inhippain with MMT    Time 3    Period Weeks    Status On-going    Target Date 03/20/21      PT SHORT TERM GOAL #3   Title Patient will be independent in self management strategies to improve quality of life and functional outcomes.    Time 3    Period Weeks    Status On-going    Target Date 03/20/21               PT Long Term Goals - 03/08/21 0908       PT  LONG TERM GOAL #1   Title Patient will report at least 75% improvement in overall symptoms and/or function to demonstrate improved functional mobility    Time 6    Period Weeks    Status On-going      PT LONG TERM GOAL #2   Title Patient will mee predicted FOTO score to demonstrate improved overall function.    Time 6    Period Weeks    Status On-going      PT LONG TERM GOAL #3   Title Patient will dmeonstrate good lifting mechanics to reduce risk of further injury    Time 6    Period Weeks    Status On-going                   Plan - 03/08/21 8270     Clinical Impression Statement Pt was late for appointment today.  Began session with review of goals, HEP and discussion of current symptoms/issues.  Began seated piriformis and hamstring stretch with good results obtained.   Began strengthening for bil LE's on mat.  Pt reported "feeling" all exercises more on her Lt than her Rt.  Pt given print outs of new exercises for exercises to do at home.    Personal Factors and Comorbidities Comorbidity 1;Comorbidity 2    Comorbidities DDD chronic, COPD    Examination-Activity Limitations Bend;Squat;Stand;Locomotion Level    Examination-Participation Restrictions Meal Prep;Driving;Community Activity;Cleaning    Stability/Clinical Decision Making Stable/Uncomplicated    Rehab Potential Good    PT Frequency 2x / week    PT Duration 6 weeks    PT Treatment/Interventions ADLs/Self Care Home Management;Aquatic Therapy;Cryotherapy;Electrical Stimulation;Moist Heat;Traction;Balance training;Therapeutic exercise;Therapeutic activities;Manual techniques;Gait training;Neuromuscular re-education;DME Instruction;Patient/family education;Joint Manipulations;Spinal Manipulations    PT Next Visit Plan continue with extension and progress LE strength  Progress to basic yoga moves per pt request and lifting mechanics.    PT Home Exercise Plan prone lumbar extension.   9/7: hamstring and piriformis  seated stretches, supine bridge, SLR and abdominal bracing.    Consulted and Agree with Plan of Care Patient             Patient will benefit from skilled  therapeutic intervention in order to improve the following deficits and impairments:  Pain, Difficulty walking, Decreased range of motion, Decreased strength, Decreased activity tolerance, Decreased mobility, Decreased balance, Hypomobility, Postural dysfunction  Visit Diagnosis: Chronic midline low back pain with right-sided sciatica  Muscle weakness (generalized)  Difficulty in walking, not elsewhere classified     Problem List Patient Active Problem List   Diagnosis Date Noted   Encounter for screening colonoscopy 07/12/2020   Polypharmacy 07/12/2020   COPD (chronic obstructive pulmonary disease) (HCC) 10/30/2019   Degeneration of spine 11/12/2018   Hyperlipidemia 11/12/2018   Ataxia 01/29/2016   Chest pain 08/10/2015   Dyspnea 08/10/2015   Chronic bronchitis (HCC) 08/10/2015   Tobacco abuse 08/10/2015   Essential hypertension 08/10/2015   Dyslipidemia 08/10/2015   Chest pain with low risk for cardiac etiology 07/01/2015   ABDOMINAL BLOATING 12/13/2009   DIARRHEA 12/13/2009   CHANGE IN BOWELS 12/13/2009   BURSITIS, SHOULDER 06/19/2007   HTN (hypertension) 06/19/2007   Lurena Nida, PTA/CLT [phone]  Lurena Nida, PTA 03/08/2021, 9:27 AM  [facility] [address] [place], [place], [address] Phone: [phone]   Fax:  [phone]  Name: Natasha [name] MRN: [id number] Date of Birth: April 24, 1961

## 2021-03-08 NOTE — Patient Instructions (Signed)
Bridge    Lie back, legs bent. Inhale, pressing hips up. Keeping ribs in, lengthen lower back. Exhale, rolling down along spine from top. Repeat _10___ times. Hold__5__seconds, do_2__ sessions per day.   Straight Leg Raise    Tighten stomach and slowly raise locked leg up even with opposite leg. Repeat _10___ times per set. Do _2___ sessions per day.    Abdominal Bracing With Pelvic Floor    With neutral spine, tighten pelvic floor and abdominals. Repeat _10__ times. Do __2_ times a day.   Piriformis Stretch, Sitting    Sit, one ankle on opposite knee, same-side hand on crossed knee. Push down on knee, keeping spine straight. Lean torso forward, with flat back, until tension is felt in hamstrings and gluteals of crossed-leg side. Hold _30__ seconds. Repeat _3__ times per session. Do _2__ sessions per day.   Hamstrings - Long Sitting    Place one leg on surface with knee straight. Lean forward keeping back straight. Hold _30__ seconds. _3__ reps per set, _2__ sets per day

## 2021-03-10 ENCOUNTER — Telehealth (HOSPITAL_COMMUNITY): Payer: Self-pay | Admitting: Physical Therapy

## 2021-03-10 NOTE — Telephone Encounter (Signed)
Pt called stating she will not continue this program and she wants to join the [facility]. Pt ask to be d/c

## 2021-03-15 ENCOUNTER — Encounter (HOSPITAL_COMMUNITY): Payer: 59 | Admitting: Physical Therapy

## 2021-03-17 ENCOUNTER — Encounter (HOSPITAL_COMMUNITY): Payer: 59

## 2021-03-21 ENCOUNTER — Encounter (HOSPITAL_COMMUNITY): Payer: 59 | Admitting: Physical Therapy

## 2021-03-23 ENCOUNTER — Encounter (HOSPITAL_COMMUNITY): Payer: 59

## 2021-03-28 ENCOUNTER — Encounter (HOSPITAL_COMMUNITY): Payer: 59 | Admitting: Physical Therapy

## 2021-03-30 ENCOUNTER — Encounter (HOSPITAL_COMMUNITY): Payer: 59 | Admitting: Physical Therapy

## 2021-04-04 ENCOUNTER — Encounter (HOSPITAL_COMMUNITY): Payer: 59 | Admitting: Physical Therapy

## 2021-04-06 ENCOUNTER — Encounter (HOSPITAL_COMMUNITY): Payer: 59 | Admitting: Physical Therapy

## 2021-04-11 ENCOUNTER — Encounter (HOSPITAL_COMMUNITY): Payer: 59 | Admitting: Physical Therapy

## 2021-04-13 ENCOUNTER — Encounter (HOSPITAL_COMMUNITY): Payer: 59 | Admitting: Physical Therapy

## 2021-08-22 DIAGNOSIS — D485 Neoplasm of uncertain behavior of skin: Secondary | ICD-10-CM | POA: Diagnosis not present

## 2021-08-22 DIAGNOSIS — I788 Other diseases of capillaries: Secondary | ICD-10-CM | POA: Diagnosis not present

## 2021-08-22 DIAGNOSIS — L821 Other seborrheic keratosis: Secondary | ICD-10-CM | POA: Diagnosis not present

## 2021-08-22 DIAGNOSIS — L918 Other hypertrophic disorders of the skin: Secondary | ICD-10-CM | POA: Diagnosis not present

## 2021-08-22 DIAGNOSIS — B079 Viral wart, unspecified: Secondary | ICD-10-CM | POA: Diagnosis not present

## 2021-08-22 DIAGNOSIS — D224 Melanocytic nevi of scalp and neck: Secondary | ICD-10-CM | POA: Diagnosis not present

## 2021-08-22 DIAGNOSIS — D2239 Melanocytic nevi of other parts of face: Secondary | ICD-10-CM | POA: Diagnosis not present

## 2021-08-22 DIAGNOSIS — D225 Melanocytic nevi of trunk: Secondary | ICD-10-CM | POA: Diagnosis not present

## 2021-08-29 DIAGNOSIS — L988 Other specified disorders of the skin and subcutaneous tissue: Secondary | ICD-10-CM | POA: Diagnosis not present

## 2021-08-29 DIAGNOSIS — D485 Neoplasm of uncertain behavior of skin: Secondary | ICD-10-CM | POA: Diagnosis not present

## 2022-01-16 DIAGNOSIS — L988 Other specified disorders of the skin and subcutaneous tissue: Secondary | ICD-10-CM | POA: Diagnosis not present

## 2022-01-16 DIAGNOSIS — D485 Neoplasm of uncertain behavior of skin: Secondary | ICD-10-CM | POA: Diagnosis not present

## 2022-01-16 DIAGNOSIS — E785 Hyperlipidemia, unspecified: Secondary | ICD-10-CM | POA: Diagnosis not present

## 2022-01-22 DIAGNOSIS — Z1389 Encounter for screening for other disorder: Secondary | ICD-10-CM | POA: Diagnosis not present

## 2022-01-22 DIAGNOSIS — Z1331 Encounter for screening for depression: Secondary | ICD-10-CM | POA: Diagnosis not present

## 2022-01-22 DIAGNOSIS — Z0000 Encounter for general adult medical examination without abnormal findings: Secondary | ICD-10-CM | POA: Diagnosis not present

## 2022-01-22 DIAGNOSIS — R82998 Other abnormal findings in urine: Secondary | ICD-10-CM | POA: Diagnosis not present

## 2022-01-22 DIAGNOSIS — I2584 Coronary atherosclerosis due to calcified coronary lesion: Secondary | ICD-10-CM | POA: Diagnosis not present

## 2022-01-22 DIAGNOSIS — I10 Essential (primary) hypertension: Secondary | ICD-10-CM | POA: Diagnosis not present

## 2022-02-19 ENCOUNTER — Other Ambulatory Visit: Payer: Self-pay | Admitting: Emergency Medicine

## 2022-02-20 ENCOUNTER — Telehealth: Payer: Self-pay | Admitting: *Deleted

## 2022-02-20 NOTE — Telephone Encounter (Signed)
Received refill request from pharmacy for Stiolto.  Patient has not been seen in >1 year.  Called and left patient a vm to return call to schedule f/u in order to refill stiolto.

## 2022-02-21 ENCOUNTER — Telehealth: Payer: Self-pay | Admitting: Emergency Medicine

## 2022-02-21 MED ORDER — STIOLTO RESPIMAT 2.5-2.5 MCG/ACT IN AERS: 2.0000 | INHALATION_SPRAY | Freq: Every day | RESPIRATORY_TRACT | 0 refills | Status: DC

## 2022-02-21 NOTE — Telephone Encounter (Signed)
Called and left voicemail that I would send in one inhaler for patient to get her to her appt with Dr Delton Coombes on 9/26. I advised her I would send it into the pharmacy she requested. Nothing further needed

## 2022-03-23 ENCOUNTER — Other Ambulatory Visit: Payer: Self-pay | Admitting: Emergency Medicine

## 2022-03-27 ENCOUNTER — Encounter: Payer: Self-pay | Admitting: Emergency Medicine

## 2022-03-27 ENCOUNTER — Ambulatory Visit: Payer: BC Managed Care – PPO | Admitting: Emergency Medicine

## 2022-03-27 DIAGNOSIS — J301 Allergic rhinitis due to pollen: Secondary | ICD-10-CM | POA: Diagnosis not present

## 2022-03-27 DIAGNOSIS — J449 Chronic obstructive pulmonary disease, unspecified: Secondary | ICD-10-CM | POA: Diagnosis not present

## 2022-03-27 DIAGNOSIS — Z720 Tobacco use: Secondary | ICD-10-CM | POA: Diagnosis not present

## 2022-03-27 DIAGNOSIS — J309 Allergic rhinitis, unspecified: Secondary | ICD-10-CM | POA: Insufficient documentation

## 2022-03-27 NOTE — Assessment & Plan Note (Signed)
Congratulations on stopping smoking.  Keep up the good work You are overdue for your lung cancer screening CT scan.  Please let us know if you would like for [place] to schedule this for you

## 2022-03-27 NOTE — Assessment & Plan Note (Signed)
  Please start fluticasone nasal spray, 2 sprays each nostril once daily.  Take this medication on a schedule during the fall allergy season. Please start loratadine 10 mg once daily.  Take this medication on a schedule during the fall allergy season.  You can consider coming off of it after the fall.

## 2022-03-27 NOTE — Assessment & Plan Note (Signed)
Please continue Stiolto 2 puffs once daily. Keep your albuterol available to use either 2 puffs or 1 nebulizer treatment up to every 4 hours if needed for shortness of breath, chest tightness, wheezing.  We will refill the albuterol nebulizer solution for you today Agree with getting your flu shot, COVID-19 shot, RSV shot, pneumonia shot as planned. Follow with Dr. Lamonte Sakai in 12 months or sooner if you have any problems.

## 2022-03-27 NOTE — Patient Instructions (Addendum)
Please continue Stiolto 2 puffs once daily. Keep your albuterol available to use either 2 puffs or 1 nebulizer treatment up to every 4 hours if needed for shortness of breath, chest tightness, wheezing.  We will refill the albuterol nebulizer solution for you today Congratulations on stopping smoking.  Keep up the good work Please start fluticasone nasal spray, 2 sprays each nostril once daily.  Take this medication on a schedule during the fall allergy season. Please start loratadine 10 mg once daily.  Take this medication on a schedule during the fall allergy season.  You can consider coming off of it after the fall. Agree with getting your flu shot, COVID-19 shot, RSV shot, pneumonia shot as planned. You are overdue for your lung cancer screening CT scan.  Please let us know if you would like for [place] to schedule this for you Follow with Dr. Lamonte Sakai in 12 months or sooner if you have any problems.

## 2022-03-27 NOTE — Progress Notes (Signed)
Subjective:    Patient ID: Natasha [name], female    DOB: [birth year], 61 y.o.   MRN: [id number]  HPI  ROV 12/13/20 --this follow-up visit for 61 year old woman with a history of tobacco use and COPD. Quit smoking in 2020 - but she takes a random puff sometimes. She has severe obstruction with a positive bronchodilator response by pulmonary function testing.  Last year we tried changing her Spiriva to Stiolto to see if she would get more benefit.  Her personal cost for Stiolto is now approximately $200 per month.  Reviewed her formulary, all comparable meds are Tier 3. She has albuterol, uses most days at once.  She had an acute bronchitis last month - was treated with steroids and abx. Better but still coughs up a small amt phlegm, clear.  Uses flonase prn, not recently  Participates in lung cancer screening program, most recent scan 06/30/2020 reviewed by me, shows multiple small pulmonary nodules similar in size and number to her previous study, largest 5.7 mm in the left lower lobe, RADS 2S study, with plan for 39-month follow-up   ROV 03/27/2022 --follow-up visit for 61 year old woman with history of tobacco use and associated COPD, still smokes occasionally.  She has severe obstructive disease.  We have been managing her on Stiolto.  She did not get her lung cancer screening CT in December 2022. Today she reports that she has been dealing with sinus drainage and intermittently. She occasionally uses benadryl. Has some non-productive cough. She has had some increased chest tightness. She remains on Stiolto believes that she benefits. Using albuterol more over the last 5 days.     Review of Systems As per HPI  Past Medical History:  Diagnosis Date   Arthritis    COPD (chronic obstructive pulmonary disease) ([place])    DDD (degenerative disc disease)    DDD (degenerative disc disease), lumbosacral    Depression    Hemorrhoids    Hyperlipidemia    Hypertension    Vitamin D deficiency       Family History  Problem Relation Age of Onset   COPD Mother    Stroke Mother    Hypertension Mother    Hyperlipidemia Mother    Depression Mother    Hypertension Father    Diabetes Father    Cancer Father    Hyperlipidemia Father    Alcohol abuse Father    Hyperlipidemia Brother    Cancer Maternal Grandmother    Stroke Paternal Grandmother    Diabetes Other    Colitis Other      Social History   Socioeconomic History   Marital status: Single    Spouse name: Not on file   Number of children: 0   Years of education: Not on file   Highest education level: Not on file  Occupational History   Occupation: accounting  Tobacco Use   Smoking status: Former    Packs/day: 1.00    Years: 35.00    Total pack years: 35.00    Types: Cigarettes    Quit date: 08/01/2018    Years since quitting: 3.6   Smokeless tobacco: Never   Tobacco comments:    takes a puff from time to time  Vaping Use   Vaping Use: Not on file  Substance and Sexual Activity   Alcohol use: Yes    Comment: 2-3 times weekly-couple beers/couple drinks at a time   Drug use: No   Sexual activity: Yes    Birth control/protection: Post-menopausal  Other Topics Concern   Not on file  Social History Narrative   Epworth Sleepiness Scale = 9 (as of 08/10/2015)   Social Determinants of Health   Financial Resource Strain: Not on file  Food Insecurity: Not on file  Transportation Needs: Not on file  Physical Activity: Not on file  Stress: Not on file  Social Connections: Not on file  Intimate Partner Violence: Not on file     No Known Allergies   Outpatient Medications Prior to Visit  Medication Sig Dispense Refill   acetaminophen (TYLENOL) 650 MG CR tablet Take 650 mg by mouth every 8 (eight) hours as needed for pain.     albuterol (VENTOLIN HFA) 108 (90 Base) MCG/ACT inhaler SMARTSIG:2 Puff(s) Via Inhaler Twice Daily PRN     ALPRAZolam (XANAX) 0.25 MG tablet Take 0.25 mg by mouth daily as needed.      Biotin w/ Vitamins C & E (HAIR/SKIN/NAILS PO) Take 1 tablet by mouth daily.     bisoprolol-hydrochlorothiazide (ZIAC) 10-6.25 MG tablet Take 1 tablet by mouth daily.     Boswellia-Glucosamine-Vit D (GLUCOSAMINE COMPLEX PO) Take 1 tablet by mouth daily.     CALCIUM PO Take by mouth 2 (two) times daily.     dextromethorphan-guaiFENesin (MUCINEX DM) 30-600 MG 12hr tablet Take 1 tablet by mouth 2 (two) times daily.     Evening Primrose Oil 500 MG CAPS evening primrose oil 500 mg capsule  Take by oral route.     hydrochlorothiazide (MICROZIDE) 12.5 MG capsule Take 12.5 mg by mouth as needed.     losartan (COZAAR) 50 MG tablet Take 50 mg by mouth daily.     Misc Natural Products (LEG VEIN & CIRCULATION PO) Take by mouth 2 (two) times daily.     MULTIPLE VITAMIN PO Take 1 tablet by mouth daily.      Nutritional Supplements (ANTIOXIDANTS PO) Take by mouth daily.     Omega-3 Fatty Acids (FISH OIL PO) Take by mouth 2 (two) times daily.     rosuvastatin (CRESTOR) 10 MG tablet rosuvastatin 10 mg tablet  Take 1 tablet every day by oral route.     STIOLTO RESPIMAT 2.5-2.5 MCG/ACT AERS INHALE 2 PUFFS INTO THE LUNGS DAILY 4 g 0   acetaminophen (TYLENOL) 500 MG tablet Take 1,000 mg by mouth every 6 (six) hours as needed.     Albuterol Sulfate 2.5 MG/0.5ML NEBU Inhale 0.5 mLs (2.5 mg total) into the lungs every 4 (four) hours as needed (wheezing). 360 mL 1   azithromycin (ZITHROMAX) 250 MG tablet Take 1 tablet (250 mg total) by mouth daily. Take first 2 tablets together, then 1 every day until finished. 6 tablet 0   BIOTIN PO Take by mouth daily.     buPROPion (WELLBUTRIN SR) 150 MG 12 hr tablet Take 150 mg by mouth. Takes 1 tablet every 3 days (trying to stop med)     fluticasone (FLONASE) 50 MCG/ACT nasal spray Place 1 spray into both nostrils daily for 14 days. (Patient taking differently: Place 1 spray into both nostrils as needed.) 16 g 0   GINSENG PO Take by mouth daily.     meloxicam (MOBIC) 15 MG tablet  Take 1 tablet (15 mg total) by mouth daily. (Patient taking differently: Take 15 mg by mouth as needed.) 30 tablet 1   Saw Palmetto, Serenoa repens, (SAW PALMETTO PO) Take by mouth daily.     No facility-administered medications prior to visit.  Objective:   Physical Exam Vitals:   03/27/22 1555  BP: 128/80  Pulse: 71  Temp: 98.2 F (36.8 C)  TempSrc: Oral  SpO2: 95%  Weight: 175 lb (79.4 kg)  Height: 5\' 10"  (1.778 m)   Gen: Pleasant, well-nourished, in no distress,  normal affect  ENT: No lesions,  mouth clear,  oropharynx clear, no postnasal drip  Neck: No JVD, no stridor  Lungs: No use of accessory muscles, no crackles or wheezing on normal respiration, she does wheeze on a forced expiration.   Cardiovascular: RRR, heart sounds normal, no murmur or gallops, no peripheral edema  Musculoskeletal: No deformities, no cyanosis or clubbing  Neuro: alert, awake, non focal  Skin: Warm, no lesions or rash     Assessment & Plan:  COPD (chronic obstructive pulmonary disease) (HCC) Please continue Stiolto 2 puffs once daily. Keep your albuterol available to use either 2 puffs or 1 nebulizer treatment up to every 4 hours if needed for shortness of breath, chest tightness, wheezing.  We will refill the albuterol nebulizer solution for you today Agree with getting your flu shot, COVID-19 shot, RSV shot, pneumonia shot as planned. Follow with Dr. in 12 months or sooner if you have any problems.   Tobacco abuse Congratulations on stopping smoking.  Keep up the good work You are overdue for your lung cancer screening CT scan.  Please let Delton Coombes know if you would like for [place] to schedule this for you  Allergic rhinitis  Please start fluticasone nasal spray, 2 sprays each nostril once daily.  Take this medication on a schedule during the fall allergy season. Please start loratadine 10 mg once daily.  Take this medication on a schedule during the fall allergy season.  You  can consider coming off of it after the fall.   [place], MD, PhD 03/27/2022, 4:36 PM [facility] [phone] or if no answer [phone]

## 2022-03-29 ENCOUNTER — Telehealth: Payer: Self-pay | Admitting: Emergency Medicine

## 2022-03-30 MED ORDER — LORATADINE 10 MG PO TABS: 10.0000 mg | ORAL_TABLET | Freq: Every day | ORAL | 11 refills | Status: AC

## 2022-03-30 MED ORDER — FLUTICASONE PROPIONATE 50 MCG/ACT NA SUSP: 2.0000 | Freq: Every day | NASAL | 2 refills | Status: DC

## 2022-03-30 NOTE — Telephone Encounter (Signed)
ATC LVMTCB X1  

## 2022-03-30 NOTE — Telephone Encounter (Signed)
Pt returned call. Verified preferred pharmacy and have sent fluticasone and loratadine to pharmacy for pt. Nothing further needed.

## 2022-03-31 ENCOUNTER — Other Ambulatory Visit: Payer: Self-pay | Admitting: Emergency Medicine

## 2022-05-04 ENCOUNTER — Other Ambulatory Visit: Payer: Self-pay | Admitting: Emergency Medicine

## 2022-06-15 ENCOUNTER — Other Ambulatory Visit: Payer: Self-pay | Admitting: Emergency Medicine

## 2022-07-25 ENCOUNTER — Other Ambulatory Visit: Payer: Self-pay | Admitting: Emergency Medicine

## 2022-11-10 ENCOUNTER — Ambulatory Visit
Admission: EM | Admit: 2022-11-10 | Discharge: 2022-11-10 | Disposition: A | Discharge status: Home / Self Care | Payer: BC Managed Care – PPO | Attending: Nurse Practitioner | Admitting: Nurse Practitioner

## 2022-11-10 ENCOUNTER — Ambulatory Visit (INDEPENDENT_AMBULATORY_CARE_PROVIDER_SITE_OTHER): Payer: BC Managed Care – PPO

## 2022-11-10 DIAGNOSIS — M545 Low back pain, unspecified: Secondary | ICD-10-CM

## 2022-11-10 DIAGNOSIS — G8929 Other chronic pain: Secondary | ICD-10-CM

## 2022-11-10 DIAGNOSIS — M25552 Pain in left hip: Secondary | ICD-10-CM | POA: Diagnosis not present

## 2022-11-10 DIAGNOSIS — Z8739 Personal history of other diseases of the musculoskeletal system and connective tissue: Secondary | ICD-10-CM | POA: Diagnosis not present

## 2022-11-10 MED ORDER — TIZANIDINE HCL 2 MG PO TABS: 2.0000 mg | ORAL_TABLET | Freq: Four times a day (QID) | ORAL | 0 refills | Status: DC | PRN

## 2022-11-10 MED ORDER — KETOROLAC TROMETHAMINE 30 MG/ML IJ SOLN
30.0000 mg | Freq: Once | INTRAMUSCULAR | Status: AC
  Administered 2022-11-10: 30 mg via INTRAMUSCULAR

## 2022-11-10 MED ORDER — DEXAMETHASONE SODIUM PHOSPHATE 10 MG/ML IJ SOLN
10.0000 mg | INTRAMUSCULAR | Status: AC
  Administered 2022-11-10: 10 mg via INTRAMUSCULAR

## 2022-11-10 MED ORDER — PREDNISONE 20 MG PO TABS: 40.0000 mg | ORAL_TABLET | Freq: Every day | ORAL | 0 refills | Status: AC

## 2022-11-10 NOTE — ED Triage Notes (Signed)
Here for R-sided hip pain that started after a fall 1 week ago. Pt denies hitting her head or LOC. Pt reports her tailbone is hurting.

## 2022-11-10 NOTE — ED Provider Notes (Signed)
[facility]    CSN: [id number] Arrival date & time: 11/10/22  1349      History   Chief Complaint Chief Complaint  Patient presents with   Fall   Hip Pain    HPI Natasha [name] is a 62 y.o. female.   The history is provided by the patient.   The patient presents for complaints of low back pain and left hip pain after fall approximately 1 week ago.  Patient states that she fell onto the left hip, but when she fell, she felt something in her lower back.  Patient states that over the past 24 hours, she has had worsening low back pain.  Patient reports history of chronic low back pain, DDD  She states that the pain is "stabbing".  She states pain is worse at night.  Patient denies numbness, tingling, lower extremity weakness, loss of bowel or bladder function, or urinary symptoms.  Patient states that she feels that the pain is radiating to the left hip.  She reports she has tried muscle relaxers, pain medication, and ice, with minimal relief.  Past Medical History:  Diagnosis Date   Arthritis    COPD (chronic obstructive pulmonary disease) (HCC)    DDD (degenerative disc disease)    DDD (degenerative disc disease), lumbosacral    Depression    Hemorrhoids    Hyperlipidemia    Hypertension    Vitamin D deficiency     Patient Active Problem List   Diagnosis Date Noted   Allergic rhinitis 03/27/2022   Encounter for screening colonoscopy 07/12/2020   Polypharmacy 07/12/2020   COPD (chronic obstructive pulmonary disease) (HCC) 10/30/2019   Degeneration of spine 11/12/2018   Hyperlipidemia 11/12/2018   Ataxia 01/29/2016   Chest pain 08/10/2015   Dyspnea 08/10/2015   Chronic bronchitis (HCC) 08/10/2015   Tobacco abuse 08/10/2015   Essential hypertension 08/10/2015   Dyslipidemia 08/10/2015   Chest pain with low risk for cardiac etiology 07/01/2015   ABDOMINAL BLOATING 12/13/2009   DIARRHEA 12/13/2009   CHANGE IN BOWELS 12/13/2009   BURSITIS, SHOULDER  06/19/2007   HTN (hypertension) 06/19/2007    Past Surgical History:  Procedure Laterality Date   CERVICAL DISC SURGERY  12/2010   Dr. Franky Macho   COLONOSCOPY  2011   Rourk: Pancolonic diverticulosis   MOUTH SURGERY     TONSILLECTOMY     as teenager, in [place]   [place] FINGER RELEASE Left 04/16/2013   Procedure: RELEASE TRIGGER FINGER/A-1 PULLEY LEFT THUMB;  Surgeon: Wyn Forster., MD;  Location: [facility];  Service: Orthopedics;  Laterality: Left;   VAGINAL HYSTERECTOMY  06/2000   partial, Dr. Jeanine Luz    OB History   No obstetric history on file.      Home Medications    Prior to Admission medications   Medication Sig Start Date End Date Taking? Authorizing Provider  albuterol (VENTOLIN HFA) 108 (90 Base) MCG/ACT inhaler SMARTSIG:2 Puff(s) Via Inhaler Twice Daily PRN 11/26/20  Yes [provider]  ALPRAZolam (XANAX) 0.25 MG tablet Take 0.25 mg by mouth daily as needed. 08/10/19  Yes [provider]  Biotin w/ Vitamins C & E (HAIR/SKIN/NAILS PO) Take 1 tablet by mouth daily.   Yes [provider]  bisoprolol-hydrochlorothiazide (ZIAC) 10-6.25 MG tablet Take 1 tablet by mouth daily.   Yes [provider]  Boswellia-Glucosamine-Vit D (GLUCOSAMINE COMPLEX PO) Take 1 tablet by mouth daily.   Yes [provider]  CALCIUM PO Take  by mouth 2 (two) times daily.   Yes [provider]  fluticasone (FLONASE) 50 MCG/ACT nasal spray SHAKE LIQUID AND USE 2 SPRAYS IN EACH NOSTRIL DAILY 04/01/22  Yes Byrum, Les Pou, MD  hydrochlorothiazide (MICROZIDE) 12.5 MG capsule Take 12.5 mg by mouth as needed.   Yes [provider]  loratadine (CLARITIN) 10 MG tablet Take 1 tablet (10 mg total) by mouth daily. 03/30/22  Yes Leslye Peer, MD  losartan (COZAAR) 50 MG tablet Take 50 mg by mouth daily.   Yes [provider]  Misc Natural Products (LEG VEIN & CIRCULATION PO) Take by mouth 2 (two) times daily.   Yes  [provider]  MULTIPLE VITAMIN PO Take 1 tablet by mouth daily.    Yes [provider]  Nutritional Supplements (ANTIOXIDANTS PO) Take by mouth daily.   Yes [provider]  Omega-3 Fatty Acids (FISH OIL PO) Take by mouth 2 (two) times daily.   Yes [provider]  predniSONE (DELTASONE) 20 MG tablet Take 2 tablets (40 mg total) by mouth daily with breakfast for 5 days. 11/10/22 11/15/22 Yes Henrik Orihuela-Warren, Sadie Haber, NP  rosuvastatin (CRESTOR) 10 MG tablet rosuvastatin 10 mg tablet  Take 1 tablet every day by oral route. 05/06/18  Yes [provider]  Tiotropium Bromide-Olodaterol (STIOLTO RESPIMAT) 2.5-2.5 MCG/ACT AERS INHALE 2 PUFFS INTO THE LUNGS DAILY 07/25/22  Yes Byrum, Les Pou, MD  tiZANidine (ZANAFLEX) 2 MG tablet Take 1 tablet (2 mg total) by mouth every 6 (six) hours as needed for muscle spasms. 11/10/22  Yes Talesha Ellithorpe-Warren, Sadie Haber, NP  acetaminophen (TYLENOL) 650 MG CR tablet Take 650 mg by mouth every 8 (eight) hours as needed for pain.    [provider]  dextromethorphan-guaiFENesin (MUCINEX DM) 30-600 MG 12hr tablet Take 1 tablet by mouth 2 (two) times daily.    [provider]  Evening Primrose Oil 500 MG CAPS evening primrose oil 500 mg capsule  Take by oral route.    [provider]    Family History Family History  Problem Relation Age of Onset   COPD Mother    Stroke Mother    Hypertension Mother    Hyperlipidemia Mother    Depression Mother    Hypertension Father    Diabetes Father    Cancer Father    Hyperlipidemia Father    Alcohol abuse Father    Hyperlipidemia Brother    Cancer Maternal Grandmother    Stroke Paternal Grandmother    Diabetes Other    Colitis Other     Social History Social History   Tobacco Use   Smoking status: Former    Packs/day: 1.00    Years: 35.00    Additional pack years: 0.00    Total pack years: 35.00    Types: Cigarettes    Quit date: 08/01/2018     Years since quitting: 4.2   Smokeless tobacco: Never   Tobacco comments:    takes a puff from time to time  Substance Use Topics   Alcohol use: Yes    Comment: 2-3 times weekly-couple beers/couple drinks at a time   Drug use: No     Allergies   Patient has no known allergies.   Review of Systems Review of Systems Per HPI  Physical Exam Triage Vital Signs ED Triage Vitals [11/10/22 1357]  Enc Vitals Group     BP (!) 160/100     Pulse Rate 92     Resp 18  Temp 98 F (36.7 C)     Temp Source Oral     SpO2 98 %     Weight      Height      Head Circumference      Peak Flow      Pain Score      Pain Loc      Pain Edu?      Excl. in GC?    No data found.  Updated Vital Signs BP (!) 160/100 (BP Location: Left Arm)   Pulse 92   Temp 98 F (36.7 C) (Oral)   Resp 18   SpO2 98%   Visual Acuity Right Eye Distance:   Left Eye Distance:   Bilateral Distance:    Right Eye Near:   Left Eye Near:    Bilateral Near:     Physical Exam Vitals and nursing note reviewed.  Constitutional:      General: She is not in acute distress.    Appearance: Normal appearance.  Pulmonary:     Effort: Pulmonary effort is normal.  Musculoskeletal:     Lumbar back: Tenderness present. No swelling, edema, deformity, signs of trauma or lacerations. Decreased range of motion.  Skin:    General: Skin is warm and dry.  Neurological:     General: No focal deficit present.     Mental Status: She is alert and oriented to person, place, and time.  Psychiatric:        Mood and Affect: Mood normal.        Behavior: Behavior normal.      UC Treatments / Results  Labs (all labs ordered are listed, but only abnormal results are displayed) Labs Reviewed - No data to display  EKG   Radiology DG Lumbar Spine Complete  Result Date: 11/10/2022 CLINICAL DATA:  Worsening lower back pain for 1 week after fall onto left hip. EXAM: LUMBAR SPINE - COMPLETE 4+ VIEW COMPARISON:  Lumbar  spine radiograph 12/15/2020 FINDINGS: There is no evidence of lumbar spine fracture. Alignment is normal. Mild to moderate degenerative disc disease of the L1-L2 through L4-L5 levels. Severe degenerative disc disease at L5-S1. Bilateral hypertrophic facet arthropathy throughout the lumbar spine, most severe at the lower levels. Aortic calcific atherosclerosis. IMPRESSION: 1. No acute fracture or traumatic malalignment of the lumbar spine. 2. Multilevel degenerative disc disease, most severe at L5-S1. 3. Bilateral hypertrophic facet arthropathy throughout the lumbar spine. 4. Aortic calcific atherosclerosis. Electronically Signed   By: Sherron Ales M.D.   On: 11/10/2022 15:23    Procedures Procedures (including critical care time)  Medications Ordered in UC Medications  ketorolac (TORADOL) 30 MG/ML injection 30 mg (30 mg Intramuscular Given 11/10/22 1515)  dexamethasone (DECADRON) injection 10 mg (10 mg Intramuscular Given 11/10/22 1514)    Initial Impression / Assessment and Plan / UC Course  I have reviewed the triage vital signs and the nursing notes.  Pertinent labs & imaging results that were available during my care of the patient were reviewed by me and considered in my medical decision making (see chart for details).  The patient is well-appearing, she is in no acute distress, vital signs are stable.  X-rays of the lumbar spine were negative for fracture or dislocation.  X-ray is consistent with severe degenerative disc disease at L5-S1.  Difficult to ascertain the cause of the patient's worsening lumbar pain, but most likely aggravated by the fall..  Do not suspect sciatica at this time.  Patient was given Toradol  30 mg IM and Decadron 30 mg IM at this appointment.  Will start patient on prednisone 40 mg for the next 5 days to help with inflammation, and tizanidine 2 mg to act as a muscle relaxer.  Supportive care recommendations were provided and discussed with the patient to include use of  Tylenol as needed for pain or discomfort, use of ice or heat, and staying active.  Patient was advised that if symptoms do not improve, recommend follow-up with her urologist.  Patient was given strict ER follow-up precautions.  Patient is in agreement with this plan of care and verbalizes understanding.  All questions were answered.  Patient stable for discharge.   Final Clinical Impressions(s) / UC Diagnoses   Final diagnoses:  Left hip pain  Chronic low back pain, unspecified back pain laterality, unspecified whether sciatica present  History of degenerative disc disease     Discharge Instructions      The x-ray was negative for fracture or dislocation.  The x-ray does show severe degenerative disc disease between L5-S1.  It is likely your symptoms were aggravated by the fall. Take medication as prescribed. Continue use of ice or heat.  Apply ice for pain or swelling, heat for spasm or stiffness.  Apply for 20 minutes, remove for 1 hour, then repeat as needed. Gentle stretching and range of motion exercises are recommended.  Try to stay as active as possible. As discussed, if symptoms do not improve, it is recommended that you follow-up with your neurosurgeon for further evaluation. Go to the emergency department immediately if you experience loss of bowel or bladder function, lower extremity weakness, become unable to walk, or other concerns. Follow-up as needed.     ED Prescriptions     Medication Sig Dispense Auth. Provider   predniSONE (DELTASONE) 20 MG tablet Take 2 tablets (40 mg total) by mouth daily with breakfast for 5 days. 10 tablet Travas Schexnayder-Warren, Sadie Haber, NP   tiZANidine (ZANAFLEX) 2 MG tablet Take 1 tablet (2 mg total) by mouth every 6 (six) hours as needed for muscle spasms. 30 tablet Ubah Radke-Warren, Sadie Haber, NP      PDMP not reviewed this encounter.   Abran Cantor, NP 11/10/22 1530

## 2022-11-10 NOTE — Discharge Instructions (Addendum)
The x-ray was negative for fracture or dislocation.  The x-ray does show severe degenerative disc disease between L5-S1.  It is likely your symptoms were aggravated by the fall. Take medication as prescribed. Continue use of ice or heat.  Apply ice for pain or swelling, heat for spasm or stiffness.  Apply for 20 minutes, remove for 1 hour, then repeat as needed. Gentle stretching and range of motion exercises are recommended.  Try to stay as active as possible. As discussed, if symptoms do not improve, it is recommended that you follow-up with your neurosurgeon for further evaluation. Go to the emergency department immediately if you experience loss of bowel or bladder function, lower extremity weakness, become unable to walk, or other concerns. Follow-up as needed.

## 2022-11-14 ENCOUNTER — Telehealth: Payer: Self-pay

## 2022-11-14 NOTE — Telephone Encounter (Signed)
PA request received for Stiolto Respimat 2.5-2.5MCG/ACT aerosol via CMM  PA submitted to [facility] and is pending additional questions/determination  Key: [id number]

## 2022-11-16 NOTE — Telephone Encounter (Signed)
Canceled. Please note this request does not require prior authorization review. Paid test claim 11/16/2022. Please advise pharmacy to reprocess.

## 2022-12-19 DIAGNOSIS — M7062 Trochanteric bursitis, left hip: Secondary | ICD-10-CM | POA: Diagnosis not present

## 2022-12-19 DIAGNOSIS — M47816 Spondylosis without myelopathy or radiculopathy, lumbar region: Secondary | ICD-10-CM | POA: Diagnosis not present

## 2022-12-24 ENCOUNTER — Ambulatory Visit
Admission: EM | Admit: 2022-12-24 | Discharge: 2022-12-24 | Disposition: A | Discharge status: Home / Self Care | Payer: BC Managed Care – PPO | Attending: Nurse Practitioner | Admitting: Nurse Practitioner

## 2022-12-24 DIAGNOSIS — G8929 Other chronic pain: Secondary | ICD-10-CM | POA: Diagnosis not present

## 2022-12-24 DIAGNOSIS — M545 Low back pain, unspecified: Secondary | ICD-10-CM

## 2022-12-24 MED ORDER — KETOROLAC TROMETHAMINE 30 MG/ML IJ SOLN
30.0000 mg | Freq: Once | INTRAMUSCULAR | Status: AC
  Administered 2022-12-24: 30 mg via INTRAMUSCULAR

## 2022-12-24 NOTE — ED Provider Notes (Signed)
[facility]    CSN: [id number] Arrival date & time: 12/24/22  1522      History   Chief Complaint Chief Complaint  Patient presents with   Back Pain    HPI Natasha [name] is a 62 y.o. female.   Patient presents today for acute left-sided low back pain that started when she was getting in her car 3 days ago.  She denies recent fall, accident, trauma, or injury involving the left side of her back or left hip.  Reports she was seen for similar on 11/10/2022 and was given steroids, muscle relaxants, and 2 injections in urgent care with improvement in symptoms.  Reports the pain is moderate to severe and sharp.  The pain does not radiate down either leg.  She denies new numbness or tingling going down her legs or in her toes, saddle anesthesia, new bowel or bladder incontinence, decreased sensation lower extremities, weakness of lower extremities, fever, nausea/vomiting, dysuria/urinary frequency, and hematuria since the symptoms began.  She has been using ice, heat, and muscle relaxant that was prescribed by neurosurgeon last week.  Reports she also took a neighbor's narcotic pain medicine which helped with pain.    Past Medical History:  Diagnosis Date   Arthritis    COPD (chronic obstructive pulmonary disease) (HCC)    DDD (degenerative disc disease)    DDD (degenerative disc disease), lumbosacral    Depression    Hemorrhoids    Hyperlipidemia    Hypertension    Vitamin D deficiency     Patient Active Problem List   Diagnosis Date Noted   Allergic rhinitis 03/27/2022   Encounter for screening colonoscopy 07/12/2020   Polypharmacy 07/12/2020   COPD (chronic obstructive pulmonary disease) (HCC) 10/30/2019   Degeneration of spine 11/12/2018   Hyperlipidemia 11/12/2018   Ataxia 01/29/2016   Chest pain 08/10/2015   Dyspnea 08/10/2015   Chronic bronchitis (HCC) 08/10/2015   Tobacco abuse 08/10/2015   Essential hypertension 08/10/2015   Dyslipidemia 08/10/2015    Chest pain with low risk for cardiac etiology 07/01/2015   ABDOMINAL BLOATING 12/13/2009   DIARRHEA 12/13/2009   CHANGE IN BOWELS 12/13/2009   BURSITIS, SHOULDER 06/19/2007   HTN (hypertension) 06/19/2007    Past Surgical History:  Procedure Laterality Date   CERVICAL DISC SURGERY  12/2010   Dr. Franky Macho   COLONOSCOPY  2011   Rourk: Pancolonic diverticulosis   MOUTH SURGERY     TONSILLECTOMY     as teenager, in [place]   [place] FINGER RELEASE Left 04/16/2013   Procedure: RELEASE TRIGGER FINGER/A-1 PULLEY LEFT THUMB;  Surgeon: Wyn Forster., MD;  Location: [facility];  Service: Orthopedics;  Laterality: Left;   VAGINAL HYSTERECTOMY  06/2000   partial, Dr. Jeanine Luz    OB History   No obstetric history on file.      Home Medications    Prior to Admission medications   Medication Sig Start Date End Date Taking? Authorizing Provider  acetaminophen (TYLENOL) 650 MG CR tablet Take 650 mg by mouth every 8 (eight) hours as needed for pain.   Yes [provider]  albuterol (VENTOLIN HFA) 108 (90 Base) MCG/ACT inhaler SMARTSIG:2 Puff(s) Via Inhaler Twice Daily PRN 11/26/20  Yes [provider]  ALPRAZolam (XANAX) 0.25 MG tablet Take 0.25 mg by mouth daily as needed. 08/10/19  Yes [provider]  Biotin w/ Vitamins C & E (HAIR/SKIN/NAILS PO) Take 1 tablet by mouth daily.   Yes [provider]  bisoprolol-hydrochlorothiazide (ZIAC) 10-6.25 MG tablet Take 1 tablet by mouth daily.   Yes [provider]  Boswellia-Glucosamine-Vit D (GLUCOSAMINE COMPLEX PO) Take 1 tablet by mouth daily.   Yes [provider]  CALCIUM PO Take by mouth 2 (two) times daily.   Yes [provider]  Evening Primrose Oil 500 MG CAPS evening primrose oil 500 mg capsule  Take by oral route.   Yes [provider]  fluticasone (FLONASE) 50 MCG/ACT nasal spray SHAKE LIQUID AND USE 2 SPRAYS IN EACH NOSTRIL DAILY 04/01/22  Yes  Byrum, Les Pou, MD  hydrochlorothiazide (MICROZIDE) 12.5 MG capsule Take 12.5 mg by mouth as needed.   Yes [provider]  loratadine (CLARITIN) 10 MG tablet Take 1 tablet (10 mg total) by mouth daily. 03/30/22  Yes Leslye Peer, MD  losartan (COZAAR) 50 MG tablet Take 50 mg by mouth daily.   Yes [provider]  Misc Natural Products (LEG VEIN & CIRCULATION PO) Take by mouth 2 (two) times daily.   Yes [provider]  MULTIPLE VITAMIN PO Take 1 tablet by mouth daily.    Yes [provider]  Nutritional Supplements (ANTIOXIDANTS PO) Take by mouth daily.   Yes [provider]  Omega-3 Fatty Acids (FISH OIL PO) Take by mouth 2 (two) times daily.   Yes [provider]  rosuvastatin (CRESTOR) 10 MG tablet rosuvastatin 10 mg tablet  Take 1 tablet every day by oral route. 05/06/18  Yes [provider]  Tiotropium Bromide-Olodaterol (STIOLTO RESPIMAT) 2.5-2.5 MCG/ACT AERS INHALE 2 PUFFS INTO THE LUNGS DAILY 07/25/22  Yes Byrum, Les Pou, MD  tiZANidine (ZANAFLEX) 2 MG tablet Take 1 tablet (2 mg total) by mouth every 6 (six) hours as needed for muscle spasms. 11/10/22  Yes Leath-Warren, Sadie Haber, NP  dextromethorphan-guaiFENesin (MUCINEX DM) 30-600 MG 12hr tablet Take 1 tablet by mouth 2 (two) times daily.    [provider]    Family History Family History  Problem Relation Age of Onset   COPD Mother    Stroke Mother    Hypertension Mother    Hyperlipidemia Mother    Depression Mother    Hypertension Father    Diabetes Father    Cancer Father    Hyperlipidemia Father    Alcohol abuse Father    Hyperlipidemia Brother    Cancer Maternal Grandmother    Stroke Paternal Grandmother    Diabetes Other    Colitis Other     Social History Social History   Tobacco Use   Smoking status: Former    Packs/day: 1.00    Years: 35.00    Additional pack years: 0.00    Total pack years: 35.00    Types: Cigarettes    Quit  date: 08/01/2018    Years since quitting: 4.4   Smokeless tobacco: Never   Tobacco comments:    takes a puff from time to time  Substance Use Topics   Alcohol use: Yes    Comment: 2-3 times weekly-couple beers/couple drinks at a time   Drug use: No     Allergies   Patient has no known allergies.   Review of Systems Review of Systems Per HPI  Physical Exam Triage Vital Signs ED Triage Vitals  Enc Vitals Group     BP 12/24/22 1532 (!) 157/87     Pulse Rate 12/24/22 1532 100     Resp 12/24/22 1532 17     Temp 12/24/22 1532 97.9  F (36.6 C)     Temp Source 12/24/22 1532 Oral     SpO2 12/24/22 1532 96 %     Weight --      Height --      Head Circumference --      Peak Flow --      Pain Score 12/24/22 1535 7     Pain Loc --      Pain Edu? --      Excl. in GC? --    No data found.  Updated Vital Signs BP (!) 157/87 (BP Location: Right Arm)   Pulse 100   Temp 97.9 F (36.6 C) (Oral)   Resp 17   SpO2 96%   Visual Acuity Right Eye Distance:   Left Eye Distance:   Bilateral Distance:    Right Eye Near:   Left Eye Near:    Bilateral Near:     Physical Exam Vitals and nursing note reviewed.  Constitutional:      General: She is not in acute distress.    Appearance: Normal appearance. She is not toxic-appearing.  HENT:     Mouth/Throat:     Mouth: Mucous membranes are moist.     Pharynx: Oropharynx is clear.  Pulmonary:     Effort: Pulmonary effort is normal. No respiratory distress.  Musculoskeletal:       Back:     Comments: Inspection: No swelling, obvious deformity, redness, or bruising to left low back or left hip Palpation: Left low back/hip nontender to palpation, patient denotes area as marked as area of pain ROM: Full ROM lumbar spine, bilateral lower extremities Strength: 5/5 bilateral lower extremities Neurovascular: neurovascularly intact in left and right lower extremity   Skin:    General: Skin is warm and dry.     Capillary Refill:  Capillary refill takes less than 2 seconds.     Coloration: Skin is not jaundiced or pale.     Findings: No erythema.  Neurological:     Mental Status: She is alert and oriented to person, place, and time.  Psychiatric:        Behavior: Behavior is cooperative.      UC Treatments / Results  Labs (all labs ordered are listed, but only abnormal results are displayed) Labs Reviewed - No data to display  EKG   Radiology No results found.  Procedures Procedures (including critical care time)  Medications Ordered in UC Medications  ketorolac (TORADOL) 30 MG/ML injection 30 mg (30 mg Intramuscular Given 12/24/22 1557)    Initial Impression / Assessment and Plan / UC Course  I have reviewed the triage vital signs and the nursing notes.  Pertinent labs & imaging results that were available during my care of the patient were reviewed by me and considered in my medical decision making (see chart for details).   Patient is well-appearing, afebrile, not tachycardic, not tachypneic, oxygenating well on room air.  Patient is mildly hypertensive in urgent care today.  1. Chronic left-sided low back pain without sciatica Noted flags in history or on exam today Vital signs are stable Will treat pain with Toradol 30 mg IM today in urgent care; suspect musculoskeletal cause of pain Educated not to take any NSAIDs for the next 48 hours, continue supportive care including Tylenol, muscle relaxant Recommended stretching and range of motion as instructed by neurosurgery and follow-up with neurosurgery for improvement/worsening symptoms despite treatment  The patient was given the opportunity to ask questions.  All questions answered to  their satisfaction.  The patient is in agreement to this plan.    Final Clinical Impressions(s) / UC Diagnoses   Final diagnoses:  Chronic left-sided low back pain without sciatica     Discharge Instructions      We have given you a shot of Toradol today  for pain in your back.  Do not take any other NSAIDs (meloxicam, Aleve, BC Goody, naproxen, ibuprofen) for 48 hours.  Continue Tylenol 500 to 1000 mg every 6 hours along with muscle relaxant that you have been prescribed previously.  Start the rehabilitation exercises that your neurosurgeon gave you.  Follow-up with your neurosurgeons office if your pain does not improve with this treatment.  Avoid taking any medications that are not prescribed to you.     ED Prescriptions   None    PDMP not reviewed this encounter.   Valentino Nose, NP 12/24/22 [phone]

## 2022-12-24 NOTE — ED Triage Notes (Signed)
Pt presents with left low back/hip pain that started Saturday. Pt taking her prescribed muscle relaxer with no relief.

## 2022-12-24 NOTE — Discharge Instructions (Signed)
We have given you a shot of Toradol today for pain in your back.  Do not take any other NSAIDs (meloxicam, Aleve, BC Goody, naproxen, ibuprofen) for 48 hours.  Continue Tylenol 500 to 1000 mg every 6 hours along with muscle relaxant that you have been prescribed previously.  Start the rehabilitation exercises that your neurosurgeon gave you.  Follow-up with your neurosurgeons office if your pain does not improve with this treatment.  Avoid taking any medications that are not prescribed to you.

## 2023-02-21 DIAGNOSIS — H5213 Myopia, bilateral: Secondary | ICD-10-CM | POA: Diagnosis not present

## 2023-02-21 DIAGNOSIS — H2513 Age-related nuclear cataract, bilateral: Secondary | ICD-10-CM | POA: Diagnosis not present

## 2023-03-01 DIAGNOSIS — L821 Other seborrheic keratosis: Secondary | ICD-10-CM | POA: Diagnosis not present

## 2023-03-01 DIAGNOSIS — D485 Neoplasm of uncertain behavior of skin: Secondary | ICD-10-CM | POA: Diagnosis not present

## 2023-03-01 DIAGNOSIS — D2239 Melanocytic nevi of other parts of face: Secondary | ICD-10-CM | POA: Diagnosis not present

## 2023-03-01 DIAGNOSIS — D692 Other nonthrombocytopenic purpura: Secondary | ICD-10-CM | POA: Diagnosis not present

## 2023-03-01 DIAGNOSIS — L570 Actinic keratosis: Secondary | ICD-10-CM | POA: Diagnosis not present

## 2023-03-01 DIAGNOSIS — D224 Melanocytic nevi of scalp and neck: Secondary | ICD-10-CM | POA: Diagnosis not present

## 2023-03-01 DIAGNOSIS — D0461 Carcinoma in situ of skin of right upper limb, including shoulder: Secondary | ICD-10-CM | POA: Diagnosis not present

## 2023-03-01 DIAGNOSIS — D225 Melanocytic nevi of trunk: Secondary | ICD-10-CM | POA: Diagnosis not present

## 2023-03-14 DIAGNOSIS — R739 Hyperglycemia, unspecified: Secondary | ICD-10-CM | POA: Diagnosis not present

## 2023-03-14 DIAGNOSIS — E785 Hyperlipidemia, unspecified: Secondary | ICD-10-CM | POA: Diagnosis not present

## 2023-04-10 ENCOUNTER — Other Ambulatory Visit: Payer: Self-pay | Admitting: Emergency Medicine

## 2023-05-08 DIAGNOSIS — I10 Essential (primary) hypertension: Secondary | ICD-10-CM | POA: Diagnosis not present

## 2023-05-08 DIAGNOSIS — H25811 Combined forms of age-related cataract, right eye: Secondary | ICD-10-CM | POA: Diagnosis not present

## 2023-05-08 DIAGNOSIS — H2511 Age-related nuclear cataract, right eye: Secondary | ICD-10-CM | POA: Diagnosis not present

## 2023-05-08 DIAGNOSIS — H21561 Pupillary abnormality, right eye: Secondary | ICD-10-CM | POA: Diagnosis not present

## 2023-05-16 DIAGNOSIS — Z961 Presence of intraocular lens: Secondary | ICD-10-CM | POA: Diagnosis not present

## 2023-06-13 ENCOUNTER — Telehealth: Payer: Self-pay | Admitting: Emergency Medicine

## 2023-06-13 NOTE — Telephone Encounter (Signed)
Pt wants to get her LDCS done but needs a order placed

## 2023-06-17 ENCOUNTER — Other Ambulatory Visit: Payer: Self-pay

## 2023-06-17 DIAGNOSIS — Z122 Encounter for screening for malignant neoplasm of respiratory organs: Secondary | ICD-10-CM

## 2023-06-17 DIAGNOSIS — Z87891 Personal history of nicotine dependence: Secondary | ICD-10-CM

## 2023-06-17 NOTE — Telephone Encounter (Signed)
New order placed. LVM to call office and schedule CT when available.

## 2023-06-19 DIAGNOSIS — H2189 Other specified disorders of iris and ciliary body: Secondary | ICD-10-CM | POA: Diagnosis not present

## 2023-06-19 DIAGNOSIS — J449 Chronic obstructive pulmonary disease, unspecified: Secondary | ICD-10-CM | POA: Diagnosis not present

## 2023-06-19 DIAGNOSIS — H2512 Age-related nuclear cataract, left eye: Secondary | ICD-10-CM | POA: Diagnosis not present

## 2023-06-19 DIAGNOSIS — H25812 Combined forms of age-related cataract, left eye: Secondary | ICD-10-CM | POA: Diagnosis not present

## 2023-06-28 ENCOUNTER — Encounter: Payer: Self-pay | Admitting: Cardiology

## 2023-06-28 ENCOUNTER — Ambulatory Visit: Payer: BC Managed Care – PPO | Attending: Cardiology | Admitting: Cardiology

## 2023-06-28 VITALS — BP 158/94 | HR 92 | Ht 70.0 in | Wt 191.0 lb

## 2023-06-28 DIAGNOSIS — R0609 Other forms of dyspnea: Secondary | ICD-10-CM | POA: Diagnosis not present

## 2023-06-28 DIAGNOSIS — E782 Mixed hyperlipidemia: Secondary | ICD-10-CM

## 2023-06-28 DIAGNOSIS — J449 Chronic obstructive pulmonary disease, unspecified: Secondary | ICD-10-CM

## 2023-06-28 DIAGNOSIS — E785 Hyperlipidemia, unspecified: Secondary | ICD-10-CM

## 2023-06-28 DIAGNOSIS — R079 Chest pain, unspecified: Secondary | ICD-10-CM

## 2023-06-28 DIAGNOSIS — I10 Essential (primary) hypertension: Secondary | ICD-10-CM | POA: Diagnosis not present

## 2023-06-28 DIAGNOSIS — R0989 Other specified symptoms and signs involving the circulatory and respiratory systems: Secondary | ICD-10-CM

## 2023-06-28 DIAGNOSIS — Z720 Tobacco use: Secondary | ICD-10-CM

## 2023-06-28 MED ORDER — METOPROLOL TARTRATE 50 MG PO TABS: ORAL_TABLET | ORAL | 0 refills | Status: AC

## 2023-06-28 MED ORDER — IVABRADINE HCL 5 MG PO TABS: 10.0000 mg | ORAL_TABLET | Freq: Once | ORAL | 0 refills | Status: AC

## 2023-06-28 MED ORDER — METOPROLOL TARTRATE 100 MG PO TABS: ORAL_TABLET | ORAL | 0 refills | Status: DC

## 2023-06-28 NOTE — Patient Instructions (Addendum)
Medication Instructions:   TAKE: Metoprolol 50 mg 1 tablet 2 hours prior to CT scan  TAKE: Ivabradine 10mg  ( 2 5mg  tablets) 2 hours prior to CT Scan   Lab Work: 3rd Floor   [address]  Your physician recommends that you return for lab work in:    You need to have labs done when you are fasting.  You can come Monday through Friday 8:00 am to 11:30AM and 1:00 to 4:00. You do not need to make an appointment as the order has already been placed.   Testing/Procedures:  Your physician has requested that you have an echocardiogram. Echocardiography is a painless test that uses sound waves to create images of your heart. It provides your doctor with information about the size and shape of your heart and how well your heart's chambers and valves are working. This procedure takes approximately one hour. There are no restrictions for this procedure. Please do NOT wear cologne, perfume, aftershave, or lotions (deodorant is allowed). Please arrive 15 minutes prior to your appointment time.  Please note: We ask at that you not bring children with you during ultrasound (echo/ vascular) testing. Due to room size and safety concerns, children are not allowed in the ultrasound rooms during exams. Our front office staff cannot provide observation of children in our lobby area while testing is being conducted. An adult accompanying a patient to their appointment will only be allowed in the ultrasound room at the discretion of the ultrasound technician under special circumstances. We apologize for any inconvenience.   Your physician has requested that you have a carotid duplex. This test is an ultrasound of the carotid arteries in your neck. It looks at blood flow through these arteries that supply the brain with blood. Allow one hour for this exam. There are no restrictions or special instructions.   Your cardiac CT will be scheduled at one of the below locations:   [facility] [address] [place], [place] [address]   Please follow these instructions carefully (unless otherwise directed):  Hold all erectile dysfunction medications at least 3 days (72 hrs) prior to test.  On the Night Before the Test: Be sure to Drink plenty of water. Do not consume any caffeinated/decaffeinated beverages or chocolate 12 hours prior to your test. Do not take any antihistamines 12 hours prior to your test.  On the Day of the Test: Drink plenty of water until 1 hour prior to the test. Do not eat any food 4 hours prior to the test. You may take your regular medications prior to the test.  Take metoprolol (Lopressor) two hours prior to test. HOLD Furosemide/Hydrochlorothiazide morning of the test. FEMALES- please wear underwire-free bra if available, avoid dresses & tight clothing       After the Test: Drink plenty of water. After receiving IV contrast, you may experience a mild flushed feeling. This is normal. On occasion, you may experience a mild rash up to 24 hours after the test. This is not dangerous. If this occurs, you can take Benadryl 25 mg and increase your fluid intake. If you experience trouble breathing, this can be serious. If it is severe call 911 IMMEDIATELY. If it is mild, please call our office. If you take any of these medications: Glipizide/Metformin, Avandament, Glucavance, please do not take 48 hours after completing test unless otherwise instructed.  We will call to schedule your test 2-4 weeks out understanding that some insurance companies will need an authorization  prior to the service being performed.   For non-scheduling related questions, please contact the cardiac imaging nurse navigator should you have any questions/concerns: Rockwell Alexandria, Cardiac Imaging Nurse Navigator Larey Brick, Cardiac Imaging Nurse Navigator [facility] Direct Office Dial: [phone]   For scheduling needs, including cancellations and rescheduling,  please call [place], [phone].    Follow-Up: At [facility], you and your health needs are our priority.  As part of our continuing mission to provide you with exceptional heart care, we have created designated Provider Care Teams.  These Care Teams include your primary Cardiologist (physician) and Advanced Practice Providers (APPs -  Physician Assistants and Nurse Practitioners) who all work together to provide you with the care you need, when you need it.  We recommend signing up for the patient portal called "MyChart".  Sign up information is provided on this After Visit Summary.  MyChart is used to connect with patients for Virtual Visits (Telemedicine).  Patients are able to view lab/test results, encounter notes, upcoming appointments, etc.  Non-urgent messages can be sent to your provider as well.   To learn more about what you can do with MyChart, go to [email or web address].    Your next appointment:   2 month(s)  The format for your next appointment:   In Person  Provider:   Gypsy Balsam, MD    Other Instructions NA

## 2023-06-28 NOTE — Progress Notes (Signed)
Cardiology Consultation:    Date:  06/28/2023   ID:  Natasha [name], DOB 1960/07/27, MRN [id number]  PCP:  Creola Corn, MD  Cardiologist:  Gypsy Balsam, MD   Referring MD: Creola Corn, MD   No chief complaint on file.   History of Present Illness:    Natasha [name] is a 62 y.o. female who is being seen today for the evaluation of shortness of breath at the request of Creola Corn, MD. past medical history significant for lifelong smoking she says she quit few months ago.  Also dyslipidemia, essential hypertension, no diabetes, does have family history of premature coronary disease.  She came to [place] because she would like to make sure her heart is okay she is worried about progressive worsening shortness of breath.  She is very much aware that shortness of breath is most likely related to her COPD but will make sure there is no cardiac condition.  Denies have any chest pain tightness squeezing pressure burning chest just while walking shortness of breath.  She said she can go from front to the back of [employer] but she gets short of breath while doing it.  Denies have any TIA/CVA-like symptoms, there is no symptoms that would suggest claudications.  She does take cholesterol medication.  She does have family history of coronary artery disease.  Past Medical History:  Diagnosis Date   Arthritis    COPD (chronic obstructive pulmonary disease) (HCC)    DDD (degenerative disc disease)    DDD (degenerative disc disease), lumbosacral    Depression    Hemorrhoids    Hyperlipidemia    Hypertension    Vitamin D deficiency     Past Surgical History:  Procedure Laterality Date   CERVICAL DISC SURGERY  12/2010   Dr. Franky Macho   COLONOSCOPY  2011   Rourk: Pancolonic diverticulosis   MOUTH SURGERY     TONSILLECTOMY     as teenager, in [place] FINGER RELEASE Left 04/16/2013   Procedure: RELEASE TRIGGER FINGER/A-1 PULLEY LEFT THUMB;  Surgeon: Wyn Forster., MD;  Location: [facility];  Service: Orthopedics;  Laterality: Left;   VAGINAL HYSTERECTOMY  06/2000   partial, Dr. Jeanine Luz    Current Medications: Current Meds  Medication Sig   acetaminophen (TYLENOL) 650 MG CR tablet Take 650 mg by mouth every 8 (eight) hours as needed for pain.   albuterol (VENTOLIN HFA) 108 (90 Base) MCG/ACT inhaler Inhale 2 puffs into the lungs 2 (two) times daily as needed for wheezing or shortness of breath.   ALPRAZolam (XANAX) 0.25 MG tablet Take 0.25 mg by mouth daily as needed for anxiety.   Biotin w/ Vitamins C & E (HAIR/SKIN/NAILS PO) Take 1 tablet by mouth daily.   bisoprolol-hydrochlorothiazide (ZIAC) 10-6.25 MG tablet Take 1 tablet by mouth daily.   Boswellia-Glucosamine-Vit D (GLUCOSAMINE COMPLEX PO) Take 1 tablet by mouth daily.   CALCIUM PO Take 1 tablet by mouth 2 (two) times daily.   Coenzyme Q10 (COQ-10) 200 MG CAPS Take 1 Capful by mouth daily.   dextromethorphan-guaiFENesin (MUCINEX DM) 30-600 MG 12hr tablet Take 1 tablet by mouth daily as needed for cough.   Evening Primrose Oil 500 MG CAPS Take 1 capsule by mouth daily.   hydrochlorothiazide (MICROZIDE) 12.5 MG capsule Take 12.5 mg by mouth as needed (fluid retention).   ivabradine (CORLANOR) 5 MG TABS tablet Take 2 tablets (10 mg total) by mouth once for 1 dose. 2  hours prior to CT scan with Metoprolol 50mg    loratadine (CLARITIN) 10 MG tablet Take 1 tablet (10 mg total) by mouth daily.   losartan (COZAAR) 50 MG tablet Take 50 mg by mouth daily.   meloxicam (MOBIC) 15 MG tablet Take 15 mg by mouth daily as needed for pain.   metoprolol tartrate (LOPRESSOR) 50 MG tablet One tablet 2 hours prior to CT scan with Ivabradine   Misc Natural Products (LEG VEIN & CIRCULATION PO) Take 1 tablet by mouth 2 (two) times daily.   MULTIPLE VITAMIN PO Take 1 tablet by mouth daily.    Nutritional Supplements (ANTIOXIDANTS PO) Take 1 tablet by mouth daily.   Omega-3 Fatty Acids (FISH OIL PO) Take 1 capsule by mouth  2 (two) times daily.   rosuvastatin (CRESTOR) 10 MG tablet Take 10 mg by mouth daily.   STIOLTO RESPIMAT 2.5-2.5 MCG/ACT AERS INHALE 2 PUFFS INTO THE LUNGS DAILY   [DISCONTINUED] fluticasone (FLONASE) 50 MCG/ACT nasal spray SHAKE LIQUID AND USE 2 SPRAYS IN EACH NOSTRIL DAILY (Patient taking differently: Place 2 sprays into both nostrils daily.)   [DISCONTINUED] metoprolol tartrate (LOPRESSOR) 100 MG tablet Take one tablet 2 hours before cardiac CT for heart greater than 55   [DISCONTINUED] tiZANidine (ZANAFLEX) 2 MG tablet Take 1 tablet (2 mg total) by mouth every 6 (six) hours as needed for muscle spasms.     Allergies:   Patient has no known allergies.   Social History   Socioeconomic History   Marital status: Single    Spouse name: Not on file   Number of children: 0   Years of education: Not on file   Highest education level: Not on file  Occupational History   Occupation: accounting  Tobacco Use   Smoking status: Former    Current packs/day: 0.00    Average packs/day: 1 pack/day for 35.0 years (35.0 ttl pk-yrs)    Types: Cigarettes    Start date: 08/02/1983    Quit date: 08/01/2018    Years since quitting: 4.9   Smokeless tobacco: Never   Tobacco comments:    takes a puff from time to time  Vaping Use   Vaping status: Not on file  Substance and Sexual Activity   Alcohol use: Yes    Comment: 2-3 times weekly-couple beers/couple drinks at a time   Drug use: No   Sexual activity: Yes    Birth control/protection: Post-menopausal  Other Topics Concern   Not on file  Social History Narrative   Epworth Sleepiness Scale = 9 (as of 08/10/2015)   Social Drivers of Corporate investment banker Strain: Not on file  Food Insecurity: Not on file  Transportation Needs: Not on file  Physical Activity: Not on file  Stress: Not on file  Social Connections: Unknown (11/10/2021)   [facility]   Social Network    Social Network: Not on file     Family  History: The patient's family history includes Alcohol abuse in her father; COPD in her mother; Cancer in her father and maternal grandmother; Colitis in an other family member; Depression in her mother; Diabetes in her father and another family member; Hyperlipidemia in her brother, father, and mother; Hypertension in her father and mother; Stroke in her mother and paternal grandmother. ROS:   Please see the history of present illness.    All 14 point review of systems negative except as described per history of present illness.  EKGs/Labs/Other Studies Reviewed:  The following studies were reviewed today:   EKG:  EKG Interpretation Date/Time:  Friday June 28 2023 14:50:00 EST Ventricular Rate:  77 PR Interval:  140 QRS Duration:  78 QT Interval:  366 QTC Calculation: 414 R Axis:   64  Text Interpretation: Normal sinus rhythm Normal ECG When compared with ECG of 29-Jan-2016 17:05, PREVIOUS ECG IS PRESENT Confirmed by Gypsy Balsam [phone]) on 06/28/2023 3:01:12 PM    Recent Labs: No results found for requested labs within last 365 days.  Recent Lipid Panel    Component Value Date/Time   CHOL 156 01/30/2016 0645   TRIG 180 (H) 01/30/2016 0645   HDL 39 (L) 01/30/2016 0645   CHOLHDL 4.0 01/30/2016 0645   VLDL 36 01/30/2016 0645   LDLCALC 81 01/30/2016 0645    Physical Exam:    VS:  BP (!) 158/94 (BP Location: Right Arm, Patient Position: Sitting)   Pulse 92   Ht 5\' 10"  (1.778 m)   Wt 191 lb (86.6 kg)   SpO2 92%   BMI 27.41 kg/m     Wt Readings from Last 3 Encounters:  06/28/23 191 lb (86.6 kg)  03/27/22 175 lb (79.4 kg)  07/12/20 191 lb 6.4 oz (86.8 kg)     GEN:  Well nourished, well developed in no acute distress HEENT: Normal NECK: No JVD; No carotid bruits LYMPHATICS: No lymphadenopathy CARDIAC: RRR, no murmurs, no rubs, no gallops RESPIRATORY:  Clear to auscultation without rales, wheezing or rhonchi  ABDOMEN: Soft, non-tender,  non-distended MUSCULOSKELETAL:  No edema; No deformity  SKIN: Warm and dry NEUROLOGIC:  Alert and oriented x 3 PSYCHIATRIC:  Normal affect   ASSESSMENT:    1. Essential hypertension   2. Dyspnea on exertion   3. Chronic obstructive pulmonary disease, unspecified COPD type (HCC)   4. Dyslipidemia   5. Carotid bruit, unspecified laterality   6. Chest pain of uncertain etiology   7. Mixed hyperlipidemia   8. Tobacco abuse    PLAN:    In order of problems listed above:  Dyspnea on exertion which is undoubtedly at least partially related to her COPD.  However it could be also related to cardiomyopathy, therefore I will ask her to have an echocardiogram done.  Also there is a potentially that she does have angina equivalent therefore, we will schedule her to have coronary CT angio to rule out possibility of significant obstructive disease.  In the meantime ask her to start taking 1 baby aspirin every single day. Dyslipidemia she is taking Crestor 10 I did review K PN which show me her LDL of 108 HDL 44.  Will wait for results of coronary CT angio to determine how aggressive we need to be when around management of this problem. Because of multiple risk factors for coronary artery disease as well as very soft bruit on the right side we will schedule him to have carotic ultrasound. History of smoking now she does not smoke she said she is very rarely only when she drinks I strongly encouraged her not to do that.   Medication Adjustments/Labs and Tests Ordered: Current medicines are reviewed at length with the patient today.  Concerns regarding medicines are outlined above.  Orders Placed This Encounter  Procedures   CT CORONARY MORPH W/CTA COR W/SCORE W/CA W/CM &/OR WO/CM   Basic metabolic panel   EKG 12-Lead   ECHOCARDIOGRAM COMPLETE   VAS US CAROTID   Meds ordered this encounter  Medications   DISCONTD: metoprolol tartrate (LOPRESSOR)  100 MG tablet    Sig: Take one tablet 2 hours  before cardiac CT for heart greater than 55    Dispense:  1 tablet    Refill:  0   metoprolol tartrate (LOPRESSOR) 50 MG tablet    Sig: One tablet 2 hours prior to CT scan with Ivabradine    Dispense:  1 tablet    Refill:  0   ivabradine (CORLANOR) 5 MG TABS tablet    Sig: Take 2 tablets (10 mg total) by mouth once for 1 dose. 2 hours prior to CT scan with Metoprolol 50mg     Dispense:  2 tablet    Refill:  0    Signed, Georgeanna Lea, MD, [facility]. 06/28/2023 3:54 PM    [facility]

## 2023-07-01 DIAGNOSIS — I10 Essential (primary) hypertension: Secondary | ICD-10-CM | POA: Diagnosis not present

## 2023-07-02 ENCOUNTER — Ambulatory Visit (HOSPITAL_COMMUNITY)
Admission: RE | Admit: 2023-07-02 | Discharge: 2023-07-02 | Disposition: A | Discharge status: Home / Self Care | Payer: BC Managed Care – PPO | Source: Ambulatory Visit | Attending: Cardiovascular Disease | Admitting: Cardiovascular Disease

## 2023-07-02 DIAGNOSIS — R0989 Other specified symptoms and signs involving the circulatory and respiratory systems: Secondary | ICD-10-CM | POA: Diagnosis not present

## 2023-07-02 LAB — BASIC METABOLIC PANEL
BUN/Creatinine Ratio: 25 (ref 12–28)
BUN: 21 mg/dL (ref 8–27)
CO2: 25 mmol/L (ref 20–29)
Calcium: 10.4 mg/dL — ABNORMAL HIGH (ref 8.7–10.3)
Chloride: 101 mmol/L (ref 96–106)
Creatinine, Ser: 0.83 mg/dL (ref 0.57–1.00)
Glucose: 97 mg/dL (ref 70–99)
Potassium: 4.9 mmol/L (ref 3.5–5.2)
Sodium: 141 mmol/L (ref 134–144)
eGFR: 79 mL/min/{1.73_m2} (ref >59)

## 2023-07-10 DIAGNOSIS — J449 Chronic obstructive pulmonary disease, unspecified: Secondary | ICD-10-CM | POA: Diagnosis not present

## 2023-07-10 DIAGNOSIS — R058 Other specified cough: Secondary | ICD-10-CM | POA: Diagnosis not present

## 2023-07-18 ENCOUNTER — Other Ambulatory Visit: Payer: Self-pay

## 2023-07-18 ENCOUNTER — Telehealth: Payer: Self-pay | Admitting: Cardiology

## 2023-07-18 NOTE — Telephone Encounter (Signed)
Pt c/o medication issue:  1. Name of Medication:  Ivabradine HCl 10 mg  2. How are you currently taking this medication (dosage and times per day)? 2 hours prior to CT scan with Metoprolol 50mg    3. Are you having a reaction (difficulty breathing--STAT)? No  4. What is your medication issue? Pt states that per pharmacy medication is needing a Prior Auth before than can fill it. Pt would like a c/b regarding this matter. Please advise

## 2023-07-18 NOTE — Telephone Encounter (Signed)
Called patient and she reported that her insurance was requesting a prior auth for the Ivabridine medication that she will be taking for the Cardiac CT. Her Cardiac CT is scheduled for 07/29/23. Is there another medication that we can use instead of Ivabridine that doesn't require a prior auth? Please advise

## 2023-07-19 NOTE — Telephone Encounter (Signed)
Left message for the patient to call back.

## 2023-07-24 NOTE — Telephone Encounter (Signed)
Spoke with pt. She will take 100mg  Metoprolol prior to CT scan . She verbalized understanding and had no further questions.

## 2023-07-26 DIAGNOSIS — R058 Other specified cough: Secondary | ICD-10-CM | POA: Diagnosis not present

## 2023-07-26 DIAGNOSIS — J449 Chronic obstructive pulmonary disease, unspecified: Secondary | ICD-10-CM | POA: Diagnosis not present

## 2023-07-29 ENCOUNTER — Ambulatory Visit (HOSPITAL_BASED_OUTPATIENT_CLINIC_OR_DEPARTMENT_OTHER): Payer: BC Managed Care – PPO

## 2023-07-31 ENCOUNTER — Ambulatory Visit (HOSPITAL_COMMUNITY): Payer: BC Managed Care – PPO

## 2023-08-09 ENCOUNTER — Ambulatory Visit (HOSPITAL_COMMUNITY): Payer: BC Managed Care – PPO

## 2023-08-09 ENCOUNTER — Telehealth: Payer: Self-pay

## 2023-08-09 NOTE — Telephone Encounter (Signed)
 Patient notified of results on VM. Encouraged to call if she has questions.

## 2023-08-09 NOTE — Telephone Encounter (Signed)
-----   Message from Ralene Burger sent at 07/05/2023  3:13 PM EST ----- Chem-7 looks good, proceed with coronary CT angio

## 2023-08-12 ENCOUNTER — Encounter (HOSPITAL_COMMUNITY): Payer: Self-pay

## 2023-08-14 ENCOUNTER — Ambulatory Visit (HOSPITAL_COMMUNITY): Admission: RE | Admit: 2023-08-14 | Payer: BC Managed Care – PPO | Source: Ambulatory Visit

## 2023-08-14 ENCOUNTER — Encounter (HOSPITAL_COMMUNITY): Payer: Self-pay

## 2023-08-21 ENCOUNTER — Ambulatory Visit (HOSPITAL_COMMUNITY): Payer: BC Managed Care – PPO

## 2023-09-30 ENCOUNTER — Ambulatory Visit: Payer: BC Managed Care – PPO | Admitting: Cardiology

## 2023-11-05 ENCOUNTER — Ambulatory Visit

## 2023-11-05 DIAGNOSIS — J441 Chronic obstructive pulmonary disease with (acute) exacerbation: Secondary | ICD-10-CM | POA: Diagnosis not present

## 2023-11-05 DIAGNOSIS — J019 Acute sinusitis, unspecified: Secondary | ICD-10-CM | POA: Diagnosis not present

## 2023-11-05 DIAGNOSIS — M546 Pain in thoracic spine: Secondary | ICD-10-CM | POA: Diagnosis not present

## 2023-11-26 DIAGNOSIS — Z1330 Encounter for screening examination for mental health and behavioral disorders, unspecified: Secondary | ICD-10-CM | POA: Diagnosis not present

## 2023-11-26 DIAGNOSIS — M25551 Pain in right hip: Secondary | ICD-10-CM | POA: Diagnosis not present

## 2023-12-04 ENCOUNTER — Ambulatory Visit

## 2023-12-10 ENCOUNTER — Other Ambulatory Visit: Payer: Self-pay | Admitting: Emergency Medicine

## 2023-12-23 NOTE — Telephone Encounter (Unsigned)
 Copied from CRM [id number]. Topic: Clinical - Medication Refill >> Dec 23, 2023  9:44 AM [name] wrote: Medication: STIOLTO RESPIMAT  2.5-2.5 MCG/ACT AERS  Has the patient contacted their pharmacy? Yes - [facility] states that they requested a refill and it was denied. Patient was unable to get her medicine.   This is the patient's preferred pharmacy:  [facility] [place], [place] - [id number] [place] [address] Phone: [phone] Fax: [phone]  Is this the correct pharmacy for this prescription? Yes  Has the prescription been filled recently? No - states almost 30 days ago, states has estimate of 10 doses left - down into red zone  Is the patient out of the medication? Yes - see above note  Has the patient been seen for an appointment in the last year OR does the patient have an upcoming appointment? No  Can we respond through MyChart? Yes  Agent: Please be advised that Rx refills may take up to 3 business days. We ask that you follow-up with your pharmacy.

## 2023-12-24 ENCOUNTER — Ambulatory Visit: Payer: Self-pay

## 2023-12-24 ENCOUNTER — Other Ambulatory Visit: Payer: Self-pay | Admitting: Emergency Medicine

## 2023-12-24 DIAGNOSIS — S0591XA Unspecified injury of right eye and orbit, initial encounter: Secondary | ICD-10-CM | POA: Diagnosis not present

## 2023-12-24 DIAGNOSIS — I10 Essential (primary) hypertension: Secondary | ICD-10-CM | POA: Diagnosis not present

## 2023-12-24 MED ORDER — STIOLTO RESPIMAT 2.5-2.5 MCG/ACT IN AERS: 2.0000 | INHALATION_SPRAY | Freq: Every day | RESPIRATORY_TRACT | 2 refills | Status: DC

## 2023-12-24 NOTE — Telephone Encounter (Signed)
 Copied from CRM [id number]. Topic: Clinical - Medication Refill >> Dec 23, 2023  9:44 AM [name] wrote: Medication: STIOLTO RESPIMAT  2.5-2.5 MCG/ACT AERS  Has the patient contacted their pharmacy? Yes - [place] states that they requested a refill and it was denied. Patient was unable to get her medicine.   This is the patient's preferred pharmacy:  [facility] [place], [place] - [id number] [place] [address] Phone: [phone] Fax: [phone]  Is this the correct pharmacy for this prescription? Yes  Has the prescription been filled recently? No - states almost 30 days ago, states has estimate of 10 doses left - down into red zone  Is the patient out of the medication? Yes - see above note  Has the patient been seen for an appointment in the last year OR does the patient have an upcoming appointment? No  Can we respond through MyChart? Yes  Agent: Please be advised that Rx refills may take up to 3 business days. We ask that you follow-up with your pharmacy. >> Dec 24, 2023  9:50 AM [name] wrote: Patient is calling to request an update on this. Sent to NT pool 06/23 around 10AM. Resending, as no note has been made, nor has it been sent to the provider's office.   Patient has been taking lower doses to avoid running out but is almost entirely out now.

## 2023-12-24 NOTE — Telephone Encounter (Signed)
 FYI Only or Action Required?: Action required by provider: medication refill request.  Patient is followed in Pulmonology for COPD, last seen on 03/27/2022 by Shelah Lamar RAMAN, MD. Called Nurse Triage reporting Medication Refill.  Triage Disposition: Call PCP Now  Patient/caregiver understands and will follow disposition?: Yes     Copied from CRM [id number]. Topic: Clinical - Medication Refill >> Dec 24, 2023  2:59 PM Natasha [name] wrote: Patient is requesting an update on this prescription refill, pharmacy advised her they do not have the order.  >> Dec 24, 2023  9:50 AM [name] wrote: Patient is calling to request an update on this. Sent to NT pool 06/23 around 10AM. Resending, as no note has been made, nor has it been sent to the provider's office.   Patient has been taking lower doses to avoid running out but is almost entirely out now.  Reason for Disposition  [1] Prescription refill request for ESSENTIAL medicine (i.e., likelihood of harm to patient if not taken) AND [2] triager unable to refill per department policy  Answer Assessment - Initial Assessment Questions 1. DRUG NAME: What medicine do you need to have refilled?     STIOLTO RESPIMAT  2.5-2.5 MCG/ACT 2. REFILLS REMAINING: How many refills are remaining? (Note: The label on the medicine or pill bottle will show how many refills are remaining. If there are no refills remaining, then a renewal may be needed.)     0 3. EXPIRATION DATE: What is the expiration date? (Note: The label states when the prescription will expire, and thus can no longer be refilled.)     N/a  4. PRESCRIBING HCP: Who prescribed it? Reason: If prescribed by specialist, call should be referred to that group.     Shelah Lamar RAMAN, MD 5. SYMPTOMS: Do you have any symptoms?     N/a 6. PREGNANCY: Is there any chance that you are pregnant? When was your last menstrual period?     N/a    Pt notified that previous Rx request was denied because F/U  appt was needed. Triager scheduled follow up appt with alternate provider at [facility]. Pt requesting additional refills until appt in August. Triager will forward encounter for Dr Shelah 's office to review and refill. Patient verbalized understanding and is expecting call back from office if they are unable to refill.  Protocols used: Medication Refill and Renewal Call-A-AH

## 2023-12-24 NOTE — Telephone Encounter (Signed)
 I spoke with the patient. I told her I could send 2 refills to get her to her appt on 8/13. I told her that we would not be able to refill it after that if she is not seen. Refill has been send.  Nothing further needed.

## 2023-12-25 ENCOUNTER — Telehealth: Payer: Self-pay

## 2023-12-25 ENCOUNTER — Other Ambulatory Visit (HOSPITAL_COMMUNITY): Payer: Self-pay

## 2023-12-25 NOTE — Telephone Encounter (Signed)
 Pharmacy Patient Advocate Encounter  Received notification from [facility] that Prior Authorization for Stiolto Respimat  2.5-2.5MCG/ACT aerosol  has been CANCELLED due to  MAX 4 QUANTITY IN 23 DAYS   PA #/Case ID/Reference #: [id number]

## 2024-02-12 ENCOUNTER — Ambulatory Visit (HOSPITAL_BASED_OUTPATIENT_CLINIC_OR_DEPARTMENT_OTHER): Admitting: Nurse Practitioner

## 2024-02-13 DIAGNOSIS — I10 Essential (primary) hypertension: Secondary | ICD-10-CM | POA: Diagnosis not present

## 2024-03-26 DIAGNOSIS — Z961 Presence of intraocular lens: Secondary | ICD-10-CM | POA: Diagnosis not present

## 2024-04-05 NOTE — Progress Notes (Unsigned)
 Established Patient Pulmonology Office Visit   Subjective:  Patient ID: Natasha [name], female    DOB: [birth year]  MRN: [id number]  CC: No chief complaint on file.   HPI  Natasha [name] is a 63 y/o F with a PMH significant for allergic rhinitis, nicotine dependence in remission, and COPD (FEV1 42% pred) who presents for follow up.    {PULM QUESTIONNAIRES (Optional):33196}  ROS  {History (Optional):23778}  Current Outpatient Medications:    acetaminophen  (TYLENOL ) 650 MG CR tablet, Take 650 mg by mouth every 8 (eight) hours as needed for pain., Disp: , Rfl:    albuterol  (VENTOLIN  HFA) 108 (90 Base) MCG/ACT inhaler, Inhale 2 puffs into the lungs 2 (two) times daily as needed for wheezing or shortness of breath., Disp: , Rfl:    ALPRAZolam (XANAX) 0.25 MG tablet, Take 0.25 mg by mouth daily as needed for anxiety., Disp: , Rfl:    Biotin w/ Vitamins C & E (HAIR/SKIN/NAILS PO), Take 1 tablet by mouth daily., Disp: , Rfl:    bisoprolol -hydrochlorothiazide  (ZIAC ) 10-6.25 MG tablet, Take 1 tablet by mouth daily., Disp: , Rfl:    Boswellia-Glucosamine-Vit D (GLUCOSAMINE COMPLEX PO), Take 1 tablet by mouth daily., Disp: , Rfl:    CALCIUM  PO, Take 1 tablet by mouth 2 (two) times daily., Disp: , Rfl:    Coenzyme Q10 (COQ-10) 200 MG CAPS, Take 1 Capful by mouth daily., Disp: , Rfl:    dextromethorphan -guaiFENesin (MUCINEX DM) 30-600 MG 12hr tablet, Take 1 tablet by mouth daily as needed for cough., Disp: , Rfl:    Evening Primrose Oil 500 MG CAPS, Take 1 capsule by mouth daily., Disp: , Rfl:    hydrochlorothiazide  (MICROZIDE ) 12.5 MG capsule, Take 12.5 mg by mouth as needed (fluid retention)., Disp: , Rfl:    loratadine  (CLARITIN ) 10 MG tablet, Take 1 tablet (10 mg total) by mouth daily., Disp: 30 tablet, Rfl: 11   losartan (COZAAR) 50 MG tablet, Take 50 mg by mouth daily., Disp: , Rfl:    meloxicam  (MOBIC ) 15 MG tablet, Take 15 mg by mouth daily as needed for pain., Disp: , Rfl:    metoprolol   tartrate (LOPRESSOR ) 50 MG tablet, One tablet 2 hours prior to CT scan with Ivabradine , Disp: 1 tablet, Rfl: 0   Misc Natural Products (LEG VEIN & CIRCULATION PO), Take 1 tablet by mouth 2 (two) times daily., Disp: , Rfl:    MULTIPLE VITAMIN PO, Take 1 tablet by mouth daily. , Disp: , Rfl:    Nutritional Supplements (ANTIOXIDANTS PO), Take 1 tablet by mouth daily., Disp: , Rfl:    Omega-3 Fatty Acids (FISH OIL PO), Take 1 capsule by mouth 2 (two) times daily., Disp: , Rfl:    rosuvastatin (CRESTOR) 10 MG tablet, Take 10 mg by mouth daily., Disp: , Rfl:    STIOLTO RESPIMAT  2.5-2.5 MCG/ACT AERS, INHALE TWO PUFFS INTO THE LUNGS DAILY., Disp: 4 g, Rfl: 2   Tiotropium Bromide-Olodaterol (STIOLTO RESPIMAT ) 2.5-2.5 MCG/ACT AERS, Inhale 2 puffs into the lungs daily., Disp: 4 g, Rfl: 2      Objective:  There were no vitals taken for this visit. {Pulm Vitals (Optional):32837}  Physical Exam   Diagnostic Review:  {Labs (Optional):32838}  PFTs 2020: severe obstructive defect, moderate reduction in gas transfer.  LDCT 06/2020: Lungs/Pleura: Multiple small pulmonary nodules are again noted throughout the lungs bilaterally, similar in size and number to prior studies, largest of which is in the medial aspect of the left lower lobe (axial image  155 of series 3), with a volume derived mean diameter of 5.7 mm. No other larger more suspicious appearing pulmonary nodules or masses are noted. No acute consolidative airspace disease. No pleural effusions. Diffuse bronchial wall thickening with mild centrilobular and paraseptal emphysema.    Assessment & Plan:   Assessment & Plan   No orders of the defined types were placed in this encounter.     No follow-ups on file.   Natasha Nevils, MD

## 2024-04-06 ENCOUNTER — Encounter (HOSPITAL_BASED_OUTPATIENT_CLINIC_OR_DEPARTMENT_OTHER): Payer: Self-pay | Admitting: Pulmonary Disease

## 2024-04-06 ENCOUNTER — Other Ambulatory Visit: Payer: Self-pay

## 2024-04-06 ENCOUNTER — Ambulatory Visit (INDEPENDENT_AMBULATORY_CARE_PROVIDER_SITE_OTHER): Admitting: Pulmonary Disease

## 2024-04-06 VITALS — BP 120/87 | HR 94 | Ht 70.0 in | Wt 192.4 lb

## 2024-04-06 DIAGNOSIS — J449 Chronic obstructive pulmonary disease, unspecified: Secondary | ICD-10-CM | POA: Diagnosis not present

## 2024-04-06 DIAGNOSIS — Z23 Encounter for immunization: Secondary | ICD-10-CM | POA: Diagnosis not present

## 2024-04-06 DIAGNOSIS — Z122 Encounter for screening for malignant neoplasm of respiratory organs: Secondary | ICD-10-CM

## 2024-04-06 DIAGNOSIS — R400 Somnolence: Secondary | ICD-10-CM | POA: Diagnosis not present

## 2024-04-06 DIAGNOSIS — F17210 Nicotine dependence, cigarettes, uncomplicated: Secondary | ICD-10-CM | POA: Diagnosis not present

## 2024-04-06 MED ORDER — ALBUTEROL SULFATE HFA 108 (90 BASE) MCG/ACT IN AERS
2.0000 | INHALATION_SPRAY | Freq: Four times a day (QID) | RESPIRATORY_TRACT | 6 refills | Status: DC | PRN
Start: 2024-04-06 — End: 2024-04-06

## 2024-04-06 MED ORDER — STIOLTO RESPIMAT 2.5-2.5 MCG/ACT IN AERS
2.0000 | INHALATION_SPRAY | Freq: Every day | RESPIRATORY_TRACT | 3 refills | Status: AC
Start: 2024-04-06 — End: ?

## 2024-04-06 MED ORDER — ALBUTEROL SULFATE HFA 108 (90 BASE) MCG/ACT IN AERS
2.0000 | INHALATION_SPRAY | Freq: Four times a day (QID) | RESPIRATORY_TRACT | 6 refills | Status: AC | PRN
Start: 2024-04-06 — End: 2025-04-06

## 2024-04-06 MED ORDER — STIOLTO RESPIMAT 2.5-2.5 MCG/ACT IN AERS
2.0000 | INHALATION_SPRAY | Freq: Every day | RESPIRATORY_TRACT | 3 refills | Status: DC
Start: 2024-04-06 — End: 2024-04-06

## 2024-04-06 NOTE — Patient Instructions (Signed)
 1- Continue Stiolto 2 puffs every day 2- Albuterol  rescue inhaler 2 puffs every 4 hours as needed 3- Albuterol  in nebulizer 1 treatment ewvery 6 hours needed 4- Pulmonary rehab - they will call you to set it up 5- They will call you to set up the lung cancer low dose CT 6- Work on smoking cessation

## 2024-04-06 NOTE — Assessment & Plan Note (Signed)
 Grade III, Class B. Plan per below.

## 2024-05-01 DIAGNOSIS — I10 Essential (primary) hypertension: Secondary | ICD-10-CM | POA: Diagnosis not present

## 2024-05-01 DIAGNOSIS — R739 Hyperglycemia, unspecified: Secondary | ICD-10-CM | POA: Diagnosis not present

## 2024-05-05 ENCOUNTER — Other Ambulatory Visit: Payer: Self-pay | Admitting: Internal Medicine

## 2024-05-05 DIAGNOSIS — Z1231 Encounter for screening mammogram for malignant neoplasm of breast: Secondary | ICD-10-CM

## 2024-05-07 NOTE — Progress Notes (Signed)
 REGINALD MANGELS                                          MRN: [id number]   05/07/2024   The [facility] Quality Team Specialist reviewed this patient medical record for the purposes of chart review for care gap closure. The following were reviewed: chart review for care gap closure-cervical cancer screening.    [facility] Quality Team

## 2024-05-26 ENCOUNTER — Ambulatory Visit

## 2024-06-03 DIAGNOSIS — L821 Other seborrheic keratosis: Secondary | ICD-10-CM | POA: Diagnosis not present

## 2024-06-03 DIAGNOSIS — D224 Melanocytic nevi of scalp and neck: Secondary | ICD-10-CM | POA: Diagnosis not present

## 2024-06-03 DIAGNOSIS — B078 Other viral warts: Secondary | ICD-10-CM | POA: Diagnosis not present

## 2024-06-03 DIAGNOSIS — L858 Other specified epidermal thickening: Secondary | ICD-10-CM | POA: Diagnosis not present

## 2024-06-03 DIAGNOSIS — L820 Inflamed seborrheic keratosis: Secondary | ICD-10-CM | POA: Diagnosis not present

## 2024-06-03 DIAGNOSIS — D2239 Melanocytic nevi of other parts of face: Secondary | ICD-10-CM | POA: Diagnosis not present

## 2024-06-23 ENCOUNTER — Ambulatory Visit
